# Patient Record
Sex: Male | Born: 1945 | Race: White | Hispanic: No | Marital: Married | Smoking: Former smoker
Health system: Southern US, Community
[De-identification: ages and names within clinical notes are randomized; demographics above are authoritative.]

## PROBLEM LIST (undated history)

## (undated) DIAGNOSIS — Z9289 Personal history of other medical treatment: Secondary | ICD-10-CM

## (undated) DIAGNOSIS — I447 Left bundle-branch block, unspecified: Secondary | ICD-10-CM

## (undated) DIAGNOSIS — Z8249 Family history of ischemic heart disease and other diseases of the circulatory system: Secondary | ICD-10-CM

## (undated) DIAGNOSIS — E785 Hyperlipidemia, unspecified: Secondary | ICD-10-CM

## (undated) HISTORY — DX: Left bundle-branch block, unspecified: I44.7

## (undated) HISTORY — DX: Hyperlipidemia, unspecified: E78.5

## (undated) HISTORY — PX: OTHER SURGICAL HISTORY: SHX169

## (undated) HISTORY — DX: Personal history of other medical treatment: Z92.89

## (undated) HISTORY — DX: Family history of ischemic heart disease and other diseases of the circulatory system: Z82.49

---

## 1994-10-05 HISTORY — PX: CARDIAC CATHETERIZATION: SHX172

## 2009-02-17 DIAGNOSIS — Z9289 Personal history of other medical treatment: Secondary | ICD-10-CM

## 2009-02-17 HISTORY — DX: Personal history of other medical treatment: Z92.89

## 2012-01-14 ENCOUNTER — Telehealth: Payer: Self-pay

## 2012-01-14 NOTE — Telephone Encounter (Signed)
Chart in Dr. Ellis Parents box

## 2012-01-14 NOTE — Telephone Encounter (Signed)
.  [facility]       Dr  Cleta Alberts REQUEST PTS  CHART BE PUT IN HIS BOX FOR REVIEW   BEST PHONE  FOR PT. [phone]

## 2012-01-15 NOTE — Telephone Encounter (Signed)
I have contacted the patient. He is scheduled to make an appointment to see me for followup CBC as well as iron studies and stool for occult blood.

## 2013-05-20 ENCOUNTER — Other Ambulatory Visit: Payer: Self-pay | Admitting: Internal Medicine

## 2013-05-20 ENCOUNTER — Ambulatory Visit: Payer: Self-pay | Admitting: Internal Medicine

## 2013-05-21 LAB — NMR LIPOPROFILE WITH LIPIDS
HDL Size: 8.2 nm — ABNORMAL LOW (ref >=9.2)
HDL-C: 38 mg/dL — ABNORMAL LOW (ref >=40)
LDL Particle Number: 2792 nmol/L — ABNORMAL HIGH (ref <1000)
LDL Size: 20.7 nm (ref >20.5)
Large HDL-P: 2.5 umol/L — ABNORMAL LOW (ref >=4.8)
Large VLDL-P: 1.4 nmol/L (ref <=2.7)
VLDL Size: 37.8 nm (ref <=46.6)

## 2013-05-26 ENCOUNTER — Ambulatory Visit (INDEPENDENT_AMBULATORY_CARE_PROVIDER_SITE_OTHER): Payer: Medicare PPO | Admitting: Internal Medicine

## 2013-05-26 ENCOUNTER — Encounter: Payer: Self-pay | Admitting: Internal Medicine

## 2013-05-26 VITALS — BP 134/80 | HR 71 | Ht 70.0 in | Wt 196.5 lb

## 2013-05-26 DIAGNOSIS — I447 Left bundle-branch block, unspecified: Secondary | ICD-10-CM | POA: Insufficient documentation

## 2013-05-26 DIAGNOSIS — Z79899 Other long term (current) drug therapy: Secondary | ICD-10-CM

## 2013-05-26 DIAGNOSIS — E785 Hyperlipidemia, unspecified: Secondary | ICD-10-CM

## 2013-05-26 DIAGNOSIS — Z8249 Family history of ischemic heart disease and other diseases of the circulatory system: Secondary | ICD-10-CM | POA: Insufficient documentation

## 2013-05-26 MED ORDER — EZETIMIBE 10 MG PO TABS: 10.0000 mg | ORAL_TABLET | Freq: Every day | ORAL | Status: DC

## 2013-05-26 MED ORDER — ATORVASTATIN CALCIUM 20 MG PO TABS: 20.0000 mg | ORAL_TABLET | Freq: Every day | ORAL | Status: DC

## 2013-05-26 NOTE — Progress Notes (Signed)
OFFICE NOTE  Chief Complaint:  Routine followup  Primary Care Physician: Lucilla Edin, MD  HPI:  Austin Moore is a pleasant 67 yo male with a strong family history of heart disease with his father having his 1st cardiac event at age 40. In addition to that, he has hyperlipidemia and has been intolerant to statins in the past because of elevated liver functions. He had been able to take Lipitor 10 mg tablets 3 times a week with no change in his liver functions and no myalgias. His blood pressure which is usually under good control away from this office still remains borderline high. He is completely asymptomatic. He has had no chest pain, no unusual shortness of breath, and no evidence of any arrhythmias. He developed a left bundle branch block in 2010 and as a result of that had a stress test performed in April 2010 that was negative for ischemia and showed a 68% ejection fraction. In 2011 the left bundle branch block had resolved and today he again has his left bundle branch block. It seemed to be intermittent but clearly does not appear to be ischemic mediated based on his stress test 2 years ago.   PMHx:  Past Medical History  Diagnosis Date  . Hyperlipidemia   . Family history of heart disease     Past Surgical History  Procedure Laterality Date  . Biospy  age 25    neck    FAMHx:  Family History  Problem Relation Age of Onset  . Cancer Mother   . Heart attack Father 44    SOCHx:   reports that he quit smoking about 40 years ago. His smoking use included Cigarettes. He smoked 0.00 packs per day. He has quit using smokeless tobacco. He reports that he drinks about 2.5 ounces of alcohol per week. He reports that he does not use illicit drugs.  ALLERGIES:  No Known Allergies  ROS: A comprehensive review of systems was negative except for: Constitutional: positive for fatigue  HOME MEDS: Current Outpatient Prescriptions  Medication Sig Dispense Refill  . aspirin 81  MG tablet Take 81 mg by mouth daily.      . fish oil-omega-3 fatty acids 1000 MG capsule Take by mouth daily.      Marland Kitchen MAGNESIUM PO Take 1 capsule by mouth daily.      Marland Kitchen atorvastatin (LIPITOR) 20 MG tablet Take 1 tablet (20 mg total) by mouth daily.  90 tablet  3  . ezetimibe (ZETIA) 10 MG tablet Take 1 tablet (10 mg total) by mouth daily.  21 tablet  0   No current facility-administered medications for this visit.    LABS/IMAGING: No results found for this or any previous visit (from the past 48 hour(s)). No results found.  VITALS: BP 134/80  Pulse 71  Ht 5\' 10"  (1.778 m)  Wt 196 lb 8 oz (89.132 kg)  BMI 28.19 kg/m2  EXAM: General appearance: alert and no distress Neck: no adenopathy, no carotid bruit, no JVD, supple, symmetrical, trachea midline and thyroid not enlarged, symmetric, no tenderness/mass/nodules Lungs: clear to auscultation bilaterally Heart: regular rate and rhythm, S1, S2 normal, no murmur, click, rub or gallop Abdomen: soft, non-tender; bowel sounds normal; no masses,  no organomegaly Extremities: extremities normal, atraumatic, no cyanosis or edema Pulses: 2+ and symmetric Skin: Skin color, texture, turgor normal. No rashes or lesions Neurologic: Grossly normal  EKG: Normal sinus rhythm at 71 with a left bundle branch block  ASSESSMENT: 1.  Stable left bundle branch block 2. Marked hyperlipidemia 3. Strong family history of premature coronary disease 4. Prior history of statin intolerance due to myalgias and elevated LFTs (Lipitor, Zocor, Vytorin, Crestor)  PLAN: 1.   Austin Moore continues to be asymptomatic and does exercise. He is stress test in 2010 did not show ischemia. He has a left bundle branch block which is chronic. Recently he underwent lipid profile testing which demonstrated a very high particle number of 2700, with elevated LDL cholesterol greater than 200. Based on this he is a very high risk of heart attack, and would benefit from marked statin  therapy. She is again willing to try statin medications but will need to follow his liver function CK is very closely. I recommend starting with Lipitor 20 mg daily and adding steady at 10 mg daily. 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 If he continues to have liver function abnormalities with Lipitor, however recommend trying Livalo, which is less heavily metabolized in the liver. Will plan a repeat cholesterol profile in 3 months and see him back at that time.  Chrystie Nose, MD, [facility] Attending Cardiologist [facility]  Austin Moore 05/26/2013, 12:52 PM

## 2013-05-26 NOTE — Patient Instructions (Addendum)
Your physician recommends that you return for lab work in: 1 week.  CMP, CK  Please have labwork done to check your cholesterol in 3 months. NMR with lipid  Your physician has recommended you make the following change in your medication: Start taking Lipitor 20mg  daily and Zetia 10mg  daily.   Dr. Rennis Golden would like you to follow up in 3-4 months, after your cholesterol blood work.

## 2013-08-11 ENCOUNTER — Encounter: Payer: Self-pay | Admitting: Emergency Medicine

## 2013-08-11 ENCOUNTER — Ambulatory Visit (INDEPENDENT_AMBULATORY_CARE_PROVIDER_SITE_OTHER): Payer: Medicare PPO | Admitting: Emergency Medicine

## 2013-08-11 ENCOUNTER — Other Ambulatory Visit: Payer: Self-pay | Admitting: Emergency Medicine

## 2013-08-11 VITALS — BP 144/77 | HR 68 | Temp 98.0°F | Resp 16 | Ht 71.0 in | Wt 191.0 lb

## 2013-08-11 DIAGNOSIS — N400 Enlarged prostate without lower urinary tract symptoms: Secondary | ICD-10-CM

## 2013-08-11 DIAGNOSIS — M25562 Pain in left knee: Secondary | ICD-10-CM

## 2013-08-11 DIAGNOSIS — R5381 Other malaise: Secondary | ICD-10-CM

## 2013-08-11 DIAGNOSIS — M25569 Pain in unspecified knee: Secondary | ICD-10-CM

## 2013-08-11 DIAGNOSIS — R5383 Other fatigue: Secondary | ICD-10-CM

## 2013-08-11 DIAGNOSIS — N529 Male erectile dysfunction, unspecified: Secondary | ICD-10-CM

## 2013-08-11 DIAGNOSIS — I2510 Atherosclerotic heart disease of native coronary artery without angina pectoris: Secondary | ICD-10-CM

## 2013-08-11 LAB — POCT URINALYSIS DIPSTICK
Bilirubin, UA: NEGATIVE
Blood, UA: NEGATIVE
Ketones, UA: NEGATIVE
Protein, UA: NEGATIVE
Spec Grav, UA: 1.015
pH, UA: 7

## 2013-08-11 LAB — CBC
HCT: 45.3 % (ref 39.0–52.0)
Hemoglobin: 15.5 g/dL (ref 13.0–17.0)
MCH: 28.2 pg (ref 26.0–34.0)
MCHC: 34.2 g/dL (ref 30.0–36.0)
MCV: 82.4 fL (ref 78.0–100.0)
RDW: 15.2 % (ref 11.5–15.5)

## 2013-08-11 MED ORDER — SILDENAFIL CITRATE 100 MG PO TABS: 50.0000 mg | ORAL_TABLET | Freq: Every day | ORAL | Status: DC | PRN

## 2013-08-11 NOTE — Progress Notes (Signed)
@[email or web address] @  Patient ID: Austin Moore MRN: [id number], DOB: 1946/02/25 67 y.o. Date of Encounter: 08/11/2013, 5:39 PM  Primary Physician: Lucilla Edin, MD  Chief Complaint: Physical (CPE)  HPI: 67 y.o. y/o male with history noted below here for CPE.  Doing well. No issues/complaints.  Review of Systems: Consitutional: No fever, chills, fatigue, night sweats, lymphadenopathy, or weight changes. Eyes: No visual changes, eye redness, or discharge. ENT/Mouth: Ears: No otalgia, tinnitus, hearing loss, discharge. Nose: No congestion, rhinorrhea, sinus pain, or epistaxis. Throat: No sore throat, post nasal drip, or teeth pain. He has noticed diminished hearing recently Cardiovascular: No CP, palpitations, diaphoresis, DOE, edema, orthopnea, PND. Patient has been to his cardiologist and had a recent good clearance from the cardiologist. He has been able to tolerate his recent statin dosages. He has not been having a severe muscle aches he had previously been Respiratory: No cough, hemoptysis, SOB, or wheezing. Gastrointestinal: No anorexia, dysphagia, reflux, pain, nausea, vomiting, hematemesis, diarrhea, constipation, BRBPR, or melena he is up-to-date on his colonoscopy is due for a repeat in about 2 years. Genitourinary: No dysuria, frequency, urgency, hematuria, incontinence, nocturia, decreased urinary stream, discharge, impotence, or testicular pain/masses. He is known to have an enlarged prostate he is gone through biopsies twice in the past with no findings of cancer Musculoskeletal: No decreased ROM, myalgias, stiffness, joint swelling, or weakness. Skin: No rash, erythema, lesion changes, pain, warmth, jaundice, or pruritis. Neurological: No headache, dizziness, syncope, seizures, tremors, memory loss, coordination problems, or paresthesias. Psychological: No anxiety, depression, hallucinations, SI/HI. Endocrine: No fatigue, polydipsia, polyphagia, polyuria, or known diabetes. All other  systems were reviewed and are otherwise negative.  Past Medical History  Diagnosis Date  . Hyperlipidemia   . Family history of heart disease      Past Surgical History  Procedure Laterality Date  . Biospy  age 68    neck    Home Meds:  Prior to Admission medications   Medication Sig Start Date End Date Taking? Authorizing Provider  aspirin 81 MG tablet Take 81 mg by mouth daily.   Yes Historical Provider, MD  atorvastatin (LIPITOR) 20 MG tablet Take 1 tablet (20 mg total) by mouth daily. 05/26/13  Yes Chrystie Nose, MD  ezetimibe (ZETIA) 10 MG tablet Take 1 tablet (10 mg total) by mouth daily. 05/26/13  Yes Chrystie Nose, MD  fish oil-omega-3 fatty acids 1000 MG capsule Take by mouth daily.   Yes Historical Provider, MD  MAGNESIUM PO Take 1 capsule by mouth daily.   Yes Historical Provider, MD  sildenafil (VIAGRA) 100 MG tablet Take 0.5-1 tablets (50-100 mg total) by mouth daily as needed for erectile dysfunction. 08/11/13   [name], MD    Allergies: No Known Allergies  History   Social History  . Marital Status: Married    Spouse Name: N/A    Number of Children: 1  . Years of Education: N/A   Occupational History  .  [employer]   Social History Main Topics  . Smoking status: Former Smoker    Types: Cigarettes    Quit date: 05/26/1973  . Smokeless tobacco: Former Neurosurgeon  . Alcohol Use: 2.5 oz/week    5 drink(s) per week  . Drug Use: No  . Sexual Activity: Not on file   Other Topics Concern  . Not on file   Social History Narrative  . No narrative on file    Family History  Problem Relation Age of Onset  .  Cancer Mother   . Heart disease Mother   . Heart attack Father 32  . Heart disease Father   . Cancer Sister     Physical Exam: Blood pressure 144/77, pulse 68, temperature 98 F (36.7 C), resp. rate 16, height 5\' 11"  (1.803 m), weight 191 lb (86.637 kg).  General: Well developed, well nourished, in no acute distress. HEENT: Normocephalic,  atraumatic. Conjunctiva pink, sclera non-icteric. Pupils 2 mm constricting to 1 mm, round, regular, and equally reactive to light and accomodation. EOMI. Internal auditory canal clear. TMs with good cone of light and without pathology. Nasal mucosa pink. Nares are without discharge. No sinus tenderness. Oral mucosa pink. Dentition . Pharynx without exudate.   Neck: Supple. Trachea midline. No thyromegaly. Full ROM. No lymphadenopathy. Lungs: Clear to auscultation bilaterally without wheezes, rales, or rhonchi. Breathing is of normal effort and unlabored. Cardiovascular: RRR with S1 S2. No murmurs, rubs, or gallops appreciated. Distal pulses 2+ symmetrically. No carotid or abdominal bruits.  Abdomen: Soft, non-tender, non-distended with normoactive bowel sounds. No hepatosplenomegaly or masses. No rebound/guarding. No CVA tenderness. Without hernias.  Rectal: No external hemorrhoids or fissures. Rectal vault without masses.   Genitourinary:   circumcised male. No penile lesions. Testes descended bilaterally, and smooth without tenderness or masses.  Musculoskeletal: Full range of motion and 5/5 strength throughout. Without swelling, atrophy, tenderness, crepitus, or warmth. Extremities without clubbing, cyanosis, or edema. Calves supple. Skin: Warm and moist without erythema, ecchymosis, wounds, or rash. Neuro: A+Ox3. CN II-XII grossly intact. Moves all extremities spontaneously. Full sensation throughout. Normal gait. DTR 2+ throughout upper and lower extremities. Finger to nose intact. Psych:  Responds to questions appropriately with a normal affect.   Studies: CBC, CMET, Lipid, PSA, TSH, Vitamin D, magnesium were all drawn.      Assessment/Plan:  67 y.o. y/o in for a physical. He would like to try Viagra and has had a recent cardiac evaluation. He is tolerating his statin as well the present time. He does have some problems with fatigue but not out of range for being 67. He is exercising on  a regular basis keeping his weight down and watching his diet and not smoking  -  Signed, Earl Lites, MD 08/11/2013 5:39 PM

## 2013-08-11 NOTE — Progress Notes (Deleted)
  Subjective:    Patient ID: Austin Moore, male    DOB: 04-Jan-1946, 67 y.o.   MRN: [id number]  HPI    Review of Systems  Constitutional: Positive for fatigue.  HENT: Positive for hearing loss, neck stiffness and tinnitus.        Objective:   Physical Exam        Assessment & Plan:

## 2013-08-12 LAB — LIPID PANEL
HDL: 39 mg/dL — ABNORMAL LOW (ref >39)
LDL Cholesterol: 81 mg/dL (ref 0–99)
Total CHOL/HDL Ratio: 3.5 Ratio
Triglycerides: 77 mg/dL (ref <150)

## 2013-08-12 LAB — COMPREHENSIVE METABOLIC PANEL
AST: 31 U/L (ref 0–37)
Albumin: 4.2 g/dL (ref 3.5–5.2)
Alkaline Phosphatase: 72 U/L (ref 39–117)
BUN: 13 mg/dL (ref 6–23)
Creat: 1.11 mg/dL (ref 0.50–1.35)
Glucose, Bld: 90 mg/dL (ref 70–99)
Potassium: 4.4 mEq/L (ref 3.5–5.3)
Total Bilirubin: 0.6 mg/dL (ref 0.3–1.2)

## 2013-08-15 ENCOUNTER — Encounter: Payer: Self-pay | Admitting: Family Medicine

## 2013-08-28 ENCOUNTER — Encounter: Payer: Self-pay | Admitting: *Deleted

## 2013-09-01 ENCOUNTER — Encounter: Payer: Self-pay | Admitting: Internal Medicine

## 2013-09-02 ENCOUNTER — Ambulatory Visit (INDEPENDENT_AMBULATORY_CARE_PROVIDER_SITE_OTHER): Payer: Medicare PPO | Admitting: Internal Medicine

## 2013-09-02 ENCOUNTER — Encounter: Payer: Self-pay | Admitting: Internal Medicine

## 2013-09-02 VITALS — BP 160/90 | HR 60 | Ht 70.0 in | Wt 193.4 lb

## 2013-09-02 DIAGNOSIS — E785 Hyperlipidemia, unspecified: Secondary | ICD-10-CM

## 2013-09-02 DIAGNOSIS — I447 Left bundle-branch block, unspecified: Secondary | ICD-10-CM

## 2013-09-02 DIAGNOSIS — Z79899 Other long term (current) drug therapy: Secondary | ICD-10-CM

## 2013-09-02 MED ORDER — EZETIMIBE 10 MG PO TABS: 10.0000 mg | ORAL_TABLET | Freq: Every day | ORAL | Status: DC

## 2013-09-02 NOTE — Patient Instructions (Signed)
Your physician wants you to follow-up in: 6 months. You will receive a reminder letter in the mail two months in advance. If you don't receive a letter, please call our office to schedule the follow-up appointment.  Please have lab work done prior to next office visit - you will need to be fasting.

## 2013-09-02 NOTE — Progress Notes (Signed)
OFFICE NOTE  Chief Complaint:  Routine followup  Primary Care Physician: Lucilla Edin, MD  HPI:  Austin Moore is a pleasant 67 yo male with a strong family history of heart disease with his father having his 1st cardiac event at age 88. In addition to that, he has hyperlipidemia and has been intolerant to statins in the past because of elevated liver functions. He had been able to take Lipitor 10 mg tablets 3 times a week with no change in his liver functions and no myalgias. His blood pressure which is usually under good control away from this office still remains borderline high. He is completely asymptomatic. He has had no chest pain, no unusual shortness of breath, and no evidence of any arrhythmias. He developed a left bundle branch block in 2010 and as a result of that had a stress test performed in April 2010 that was negative for ischemia and showed a 68% ejection fraction. In 2011 the left bundle branch block had resolved and today he again has his left bundle branch block. It seemed to be intermittent but clearly does not appear to be ischemic mediated based on his stress test 2 years ago.   At his last visit, I recommended adding low-dose Lipitor and Zetia back to his regimen. This is do to an elevated lipid profile with him an MR that demonstrated an LDL particle number of 2700. His LDL C. at that time was 213. Over the past 3 months he's increased his exercise as noted a marked improvement in his energy level. He says he's been tolerating his medications and has had no problems with muscle pain or soreness. His recent lipid profile demonstrated total cholesterol 135, triglycerides 77, HDL 39 and LDL 81, a marked improvement in his lipid profile. The remainder of his labs including a CK was normal at 95 an AST and ALT were both within normal limits. Does not appear that he is having the liver enzyme abnormalities she had in the past. He is very pleased with his current medicine  regimen.  PMHx:  Past Medical History  Diagnosis Date  . Hyperlipidemia     intolerant to statins - elevated LFTs  . Family history of heart disease   . LBBB (left bundle branch block)   . History of nuclear stress test 02/17/2009    dipyridamole; negative for ischemia, EF 68% - r/t LBBB    Past Surgical History  Procedure Laterality Date  . Biospy  age 78 - 1968    neck  . Cardiac catheterization  10/1994    after false positive Thallium test - normal L main/LAD; L Cfx is large dominant vessel, RCA non-dominant (Dr. Langston Reusing)    FAMHx:  Family History  Problem Relation Age of Onset  . Cancer Mother   . Heart disease Mother   . Heart attack Father 13  . Heart disease Father   . Cancer Sister     SOCHx:   reports that he quit smoking about 43 years ago. His smoking use included Cigarettes. He smoked 0.00 packs per day for 6 years. He has quit using smokeless tobacco. He reports that he drinks about 2.5 ounces of alcohol per week. He reports that he does not use illicit drugs.  ALLERGIES:  Allergies  Allergen Reactions  . Lipitor [Atorvastatin]     Elevated liver enzymes  . Vytorin [Ezetimibe-Simvastatin]     Elevated liver enzymes  . Zocor [Simvastatin]     Elevated  liver enzymes    ROS: A comprehensive review of systems was negative.  HOME MEDS: Current Outpatient Prescriptions  Medication Sig Dispense Refill  . aspirin 81 MG tablet Take 81 mg by mouth daily.      Marland Kitchen atorvastatin (LIPITOR) 20 MG tablet Take 1 tablet (20 mg total) by mouth daily.  90 tablet  3  . Cholecalciferol (VITAMIN D-3 PO) Take by mouth daily.      Marland Kitchen ezetimibe (ZETIA) 10 MG tablet Take 1 tablet (10 mg total) by mouth daily.  38 tablet  0  . fish oil-omega-3 fatty acids 1000 MG capsule Take by mouth every other day.       Marland Kitchen MAGNESIUM PO Take 1 capsule by mouth daily.      . sildenafil (VIAGRA) 100 MG tablet Take 0.5-1 tablets (50-100 mg total) by mouth daily as needed for erectile  dysfunction.  5 tablet  11   No current facility-administered medications for this visit.    LABS/IMAGING: No results found for this or any previous visit (from the past 48 hour(s)). No results found.  VITALS: BP 160/90  Pulse 60  Ht 5\' 10"  (1.778 m)  Wt 193 lb 6.4 oz (87.726 kg)  BMI 27.75 kg/m2  EXAM: General appearance: alert and no distress Lungs: clear to auscultation bilaterally Heart: regular rate and rhythm, S1, S2 normal, no murmur, click, rub or gallop Extremities: extremities normal, atraumatic, no cyanosis or edema  EKG: Normal sinus rhythm at 60 with a left bundle branch block  ASSESSMENT: 1. Stable left bundle branch block 2. Hyperlipidemia - significantly improved 3. Strong family history of premature coronary disease 4. Tolerating Lipitor, despite a prior history of statin intolerance due to myalgias and elevated LFTs (Lipitor, Zocor, Vytorin, Crestor)  PLAN: 1.   Mr. Nickolson has had a marked improvement in his lipid profile on his current regimen. There is no evidence of abnormalities in his LFTs or CK. He is tolerating his current medication regimen and will go ahead and give him more samples of Zetia today.  Have encouraged him to continue to work on exercise and diet modification, but feel that he is now a aggressively managing his risk factors.  Plan to see him back in 6 months with another lipid profile with NMR at that time.  Chrystie Nose, MD, [facility] Attending Cardiologist [facility]  HILTY,Kenneth C 09/02/2013, 8:59 AM

## 2014-01-08 ENCOUNTER — Telehealth: Payer: Self-pay | Admitting: *Deleted

## 2014-01-08 NOTE — Telephone Encounter (Signed)
Pt called stated that he needs to have a lab slip mailed to him so that he can have his blood work done.  CH

## 2014-01-08 NOTE — Telephone Encounter (Signed)
Returned call and pt verified x 2.  Pt informed message received and lab order not needed.  Informed order in computer system w/ [facility] and advised he arrive fasting to have labs drawn.  Pt verbalized understanding and agreed w/ plan.

## 2014-01-27 ENCOUNTER — Telehealth: Payer: Self-pay | Admitting: Internal Medicine

## 2014-01-27 DIAGNOSIS — Z79899 Other long term (current) drug therapy: Secondary | ICD-10-CM

## 2014-01-27 DIAGNOSIS — E782 Mixed hyperlipidemia: Secondary | ICD-10-CM

## 2014-01-27 LAB — COMPREHENSIVE METABOLIC PANEL
ALT: 34 U/L (ref 0–53)
AST: 32 U/L (ref 0–37)
Albumin: 4.4 g/dL (ref 3.5–5.2)
Alkaline Phosphatase: 75 U/L (ref 39–117)
BUN: 12 mg/dL (ref 6–23)
CO2: 27 mEq/L (ref 19–32)
Calcium: 9.2 mg/dL (ref 8.4–10.5)
Chloride: 106 mEq/L (ref 96–112)
Creat: 1.05 mg/dL (ref 0.50–1.35)
Glucose, Bld: 94 mg/dL (ref 70–99)
Potassium: 4.1 mEq/L (ref 3.5–5.3)
Sodium: 142 mEq/L (ref 135–145)
Total Bilirubin: 0.6 mg/dL (ref 0.2–1.2)
Total Protein: 6.7 g/dL (ref 6.0–8.3)

## 2014-01-27 NOTE — Telephone Encounter (Signed)
[facility] lab called stated Mr Austin Moore is also requesting a PSA be drawn with the lab that you have already ordered for him

## 2014-01-27 NOTE — Telephone Encounter (Signed)
Lab orders expired.  Reordered labs.  Unable to reach anyone in the lab.

## 2014-01-28 LAB — NMR LIPOPROFILE WITH LIPIDS
Cholesterol, Total: 148 mg/dL (ref <200)
HDL Particle Number: 29.1 umol/L — ABNORMAL LOW (ref >=30.5)
HDL Size: 8.5 nm — ABNORMAL LOW (ref >=9.2)
HDL-C: 36 mg/dL — ABNORMAL LOW (ref >=40)
LDL (calc): 99 mg/dL (ref <100)
LDL Particle Number: 1483 nmol/L — ABNORMAL HIGH (ref <1000)
LDL Size: 20.1 nm — ABNORMAL LOW (ref >20.5)
LP-IR Score: 55 — ABNORMAL HIGH (ref <=45)
Large HDL-P: 1.4 umol/L — ABNORMAL LOW (ref >=4.8)
Large VLDL-P: 0.9 nmol/L (ref <=2.7)
Small LDL Particle Number: 1026 nmol/L — ABNORMAL HIGH (ref <=527)
Triglycerides: 67 mg/dL (ref <150)
VLDL Size: 44.4 nm (ref <=46.6)

## 2014-01-29 ENCOUNTER — Encounter: Payer: Self-pay | Admitting: Internal Medicine

## 2014-01-29 ENCOUNTER — Ambulatory Visit (INDEPENDENT_AMBULATORY_CARE_PROVIDER_SITE_OTHER): Payer: Medicare PPO | Admitting: Internal Medicine

## 2014-01-29 VITALS — BP 134/80 | HR 67 | Ht 70.0 in | Wt 191.1 lb

## 2014-01-29 DIAGNOSIS — Z8249 Family history of ischemic heart disease and other diseases of the circulatory system: Secondary | ICD-10-CM

## 2014-01-29 DIAGNOSIS — I447 Left bundle-branch block, unspecified: Secondary | ICD-10-CM

## 2014-01-29 DIAGNOSIS — E785 Hyperlipidemia, unspecified: Secondary | ICD-10-CM

## 2014-01-29 NOTE — Patient Instructions (Signed)
Your physician wants you to follow-up in: 1 year. You will receive a reminder letter in the mail two months in advance. If you don't receive a letter, please call our office to schedule the follow-up appointment.  

## 2014-01-29 NOTE — Progress Notes (Signed)
OFFICE NOTE  Chief Complaint:  Routine followup  Primary Care Physician: Lucilla Edin, MD  HPI:  Austin Moore is a pleasant 68 yo male with a strong family history of heart disease with his father having his 1st cardiac event at age 29. In addition to that, he has hyperlipidemia and has been intolerant to statins in the past because of elevated liver functions. He had been able to take Lipitor 10 mg tablets 3 times a week with no change in his liver functions and no myalgias. His blood pressure which is usually under good control away from this office still remains borderline high. He is completely asymptomatic. He has had no chest pain, no unusual shortness of breath, and no evidence of any arrhythmias. He developed a left bundle branch block in 2010 and as a result of that had a stress test performed in April 2010 that was negative for ischemia and showed a 68% ejection fraction. In 2011 the left bundle branch block had resolved and today he again has his left bundle branch block. It seemed to be intermittent but clearly does not appear to be ischemic mediated based on his stress test 2 years ago.   At his last visit, I recommended adding low-dose Lipitor and Zetia back to his regimen. This is do to an elevated lipid profile with him an MR that demonstrated an LDL particle number of 2700. His LDL C. at that time was 213. Over the past 3 months he's increased his exercise as noted a marked improvement in his energy level. He says he's been tolerating his medications and has had no problems with muscle pain or soreness. His recent lipid profile demonstrated total cholesterol 135, triglycerides 77, HDL 39 and LDL 81, a marked improvement in his lipid profile. The remainder of his labs including a CK was normal at 95 an AST and ALT were both within normal limits. Does not appear that he is having the liver enzyme abnormalities she had in the past. He is very pleased with his current medicine  regimen.  Austin Moore returns today for followup of his lipid profile.  There's been a slight increase in cholesterol since his last cholesterol test however LDL particle number is improved. Currently his LDL-P is 1483, down from 2792.  LDL C. is 99, down from 213. Total cholesterol is 148. This demonstrated marked improvement over his previous blood work which is in part due to his exercise, diet and the addition of Zetia. I've been hesitant to increase the dose of Lipitor due to his abnormal liver function and problems with statins in the past. He seems to be tolerating the medication combination currently without side effects. Comprehensive metabolic profile shows normal AST and ALT as well as renal function.  PMHx:  Past Medical History  Diagnosis Date  . Hyperlipidemia     intolerant to statins - elevated LFTs  . Family history of heart disease   . LBBB (left bundle branch block)   . History of nuclear stress test 02/17/2009    dipyridamole; negative for ischemia, EF 68% - r/t LBBB    Past Surgical History  Procedure Laterality Date  . Biospy  age 65 - 1968    neck  . Cardiac catheterization  10/1994    after false positive Thallium test - normal L main/LAD; L Cfx is large dominant vessel, RCA non-dominant (Dr. Langston Reusing)    FAMHx:  Family History  Problem Relation Age of Onset  .  Cancer Mother   . Heart disease Mother   . Heart attack Father [id number]  . Heart disease Father   . Cancer Sister     SOCHx:   reports that he quit smoking about 43 years ago. His smoking use included Cigarettes. He smoked 0.00 packs per day for 6 years. He has quit using smokeless tobacco. He reports that he drinks about 2.5 ounces of alcohol per week. He reports that he does not use illicit drugs.  ALLERGIES:  Allergies  Allergen Reactions  . Lipitor [Atorvastatin]     Elevated liver enzymes  . Vytorin [Ezetimibe-Simvastatin]     Elevated liver enzymes  . Zocor [Simvastatin]     Elevated liver  enzymes    ROS: A comprehensive review of systems was negative.  HOME MEDS: Current Outpatient Prescriptions  Medication Sig Dispense Refill  . aspirin 81 MG tablet Take 81 mg by mouth daily.      Marland Kitchen. atorvastatin (LIPITOR) 20 MG tablet Take 1 tablet (20 mg total) by mouth daily.  90 tablet  3  . Cholecalciferol (VITAMIN D-3 PO) Take by mouth daily.      Marland Kitchen. ezetimibe (ZETIA) 10 MG tablet Take 1 tablet (10 mg total) by mouth daily.  38 tablet  0  . fish oil-omega-3 fatty acids 1000 MG capsule Take by mouth every other day.       Marland Kitchen. MAGNESIUM PO Take 1 capsule by mouth daily.      . sildenafil (VIAGRA) 100 MG tablet Take 0.5-1 tablets (50-100 mg total) by mouth daily as needed for erectile dysfunction.  5 tablet  11   No current facility-administered medications for this visit.    LABS/IMAGING: No results found for this or any previous visit (from the past 48 hour(s)). No results found.  VITALS: BP 134/80  Pulse 67  Ht 5\' 10"  (1.778 m)  Wt 191 lb 1.6 oz (86.682 kg)  BMI 27.42 kg/m2  EXAM: deferred  EKG: Normal sinus rhythm at 67 with a left bundle branch block  ASSESSMENT: 1. Stable left bundle branch block 2. Hyperlipidemia - significantly improved 3. Strong family history of premature coronary disease 4. Tolerating Lipitor and Zetia, despite a prior history of statin intolerance due to myalgias and elevated LFTs (Lipitor, Zocor, Vytorin, Crestor)  PLAN: 1.   Austin Moore has had a marked improvement in his lipid profile on his current regimen. There is no evidence of abnormalities in his LFTs or CK. He is tolerating his current medication regimen.  He continues to exercise and I again encouraged him to continue to work on exercise and diet modification.  Plan to see him back in 1 year or sooner as necessary.  Chrystie NoseKenneth C. Hilty, MD, [facility] Attending Cardiologist [facility]  HILTY,Kenneth C 01/29/2014, 1:07 PM

## 2014-10-16 ENCOUNTER — Other Ambulatory Visit: Payer: Self-pay | Admitting: Internal Medicine

## 2014-10-18 NOTE — Telephone Encounter (Signed)
Rx refill sent to patient pharmacy   

## 2014-11-16 ENCOUNTER — Encounter: Payer: Self-pay | Admitting: Emergency Medicine

## 2014-11-16 ENCOUNTER — Ambulatory Visit (INDEPENDENT_AMBULATORY_CARE_PROVIDER_SITE_OTHER): Payer: Medicare PPO | Admitting: Emergency Medicine

## 2014-11-16 VITALS — BP 130/92 | HR 72 | Temp 98.3°F | Resp 16 | Ht 70.25 in | Wt 189.8 lb

## 2014-11-16 DIAGNOSIS — N400 Enlarged prostate without lower urinary tract symptoms: Secondary | ICD-10-CM

## 2014-11-16 DIAGNOSIS — H539 Unspecified visual disturbance: Secondary | ICD-10-CM

## 2014-11-16 DIAGNOSIS — Z1211 Encounter for screening for malignant neoplasm of colon: Secondary | ICD-10-CM

## 2014-11-16 DIAGNOSIS — Z0000 Encounter for general adult medical examination without abnormal findings: Secondary | ICD-10-CM

## 2014-11-16 DIAGNOSIS — Z23 Encounter for immunization: Secondary | ICD-10-CM

## 2014-11-16 DIAGNOSIS — E785 Hyperlipidemia, unspecified: Secondary | ICD-10-CM

## 2014-11-16 LAB — CBC WITH DIFFERENTIAL/PLATELET
BASOS ABS: 0.1 10*3/uL (ref 0.0–0.1)
Basophils Relative: 1 % (ref 0–1)
Eosinophils Absolute: 0.2 10*3/uL (ref 0.0–0.7)
Eosinophils Relative: 4 % (ref 0–5)
HEMATOCRIT: 49.8 % (ref 39.0–52.0)
Hemoglobin: 16.4 g/dL (ref 13.0–17.0)
LYMPHS PCT: 25 % (ref 12–46)
Lymphs Abs: 1.6 10*3/uL (ref 0.7–4.0)
MCH: 27.9 pg (ref 26.0–34.0)
MCHC: 32.9 g/dL (ref 30.0–36.0)
MCV: 84.8 fL (ref 78.0–100.0)
MONOS PCT: 7 % (ref 3–12)
MPV: 9.3 fL (ref 8.6–12.4)
Monocytes Absolute: 0.4 10*3/uL (ref 0.1–1.0)
Neutro Abs: 3.9 10*3/uL (ref 1.7–7.7)
Neutrophils Relative %: 63 % (ref 43–77)
Platelets: 275 10*3/uL (ref 150–400)
RBC: 5.87 MIL/uL — AB (ref 4.22–5.81)
RDW: 13.6 % (ref 11.5–15.5)
WBC: 6.2 10*3/uL (ref 4.0–10.5)

## 2014-11-16 LAB — COMPLETE METABOLIC PANEL WITH GFR
ALBUMIN: 4.1 g/dL (ref 3.5–5.2)
ALT: 21 U/L (ref 0–53)
AST: 27 U/L (ref 0–37)
Alkaline Phosphatase: 70 U/L (ref 39–117)
BUN: 15 mg/dL (ref 6–23)
CALCIUM: 9.1 mg/dL (ref 8.4–10.5)
CO2: 26 mEq/L (ref 19–32)
Chloride: 105 mEq/L (ref 96–112)
Creat: 1.07 mg/dL (ref 0.50–1.35)
GFR, Est African American: 82 mL/min
GFR, Est Non African American: 71 mL/min
Glucose, Bld: 97 mg/dL (ref 70–99)
Potassium: 4.3 mEq/L (ref 3.5–5.3)
Sodium: 141 mEq/L (ref 135–145)
TOTAL PROTEIN: 6.7 g/dL (ref 6.0–8.3)
Total Bilirubin: 0.7 mg/dL (ref 0.2–1.2)

## 2014-11-16 LAB — TSH: TSH: 1.989 u[IU]/mL (ref 0.350–4.500)

## 2014-11-16 LAB — LIPID PANEL
CHOL/HDL RATIO: 4.5 ratio
CHOLESTEROL: 184 mg/dL (ref 0–200)
HDL: 41 mg/dL (ref >39)
LDL Cholesterol: 128 mg/dL — ABNORMAL HIGH (ref 0–99)
Triglycerides: 76 mg/dL (ref <150)
VLDL: 15 mg/dL (ref 0–40)

## 2014-11-16 LAB — POCT URINALYSIS DIPSTICK
BILIRUBIN UA: NEGATIVE
Blood, UA: NEGATIVE
Glucose, UA: NEGATIVE
Ketones, UA: NEGATIVE
LEUKOCYTES UA: NEGATIVE
Nitrite, UA: NEGATIVE
PH UA: 5.5
PROTEIN UA: NEGATIVE
Spec Grav, UA: 1.015
Urobilinogen, UA: 0.2

## 2014-11-16 LAB — IFOBT (OCCULT BLOOD): IFOBT: NEGATIVE

## 2014-11-16 NOTE — Progress Notes (Signed)
Subjective:  This chart was scribed for Austin Gobble, MD by Charline Bills, ED Scribe. The patient was seen in room 21. Patient's care was started at 2:03 PM.   Patient ID: Austin Moore, male    DOB: 1946-01-12, 69 y.o.   MRN: [id number]  Chief Complaint  Patient presents with  . Annual Exam    no medication refills   HPI HPI Comments: Austin Moore is a 69 y.o. male, with a h/o LBBB, hyperlipidemia, who presents to the [facility] for an annual exam.   Pt states that he has been going to the gym for approximately 2 years now, but recently stopped due to the holiday.   Abdominal Pain Pt reports constant RUQ pain that he describes as a pulling sensation present for the past few days. He suspects that he pulled a muscle while doing crunches.    Urology  Pt states that he had a flare up of similar prostate problems. Pt reports limited urine flow which has resolved. Pt states that symptoms are exacerbated with alcohol and coffee consumption; which he reports an increase of during the holidays.   Mouth Pt recently noticed a dark spot inside his L lower lip that he wishes to have evaluated. He denies pain.   Vision Pt has noticed irregularity and an "arc" in his vision over the past 3-4 months. Pt reports a few episodes that each last for a few minutes. He states that he was dehydrated 2 times when he has experienced this. Pt denies vision loss. He has never had carotid neck study done.   Preventative Maintenance  Last colonoscopy was done by Dr. Kinnie Scales approximately 10 years ago. Pt sees his eye doctor x1 every 2 years.  Cardiologist: Hilty  Urologist: Vernie Ammons; pt has been released  Pt recently had a new granddaughter 3 months ago; total of 3 grandchildren in  [place].   Past Medical History  Diagnosis Date  . Hyperlipidemia     intolerant to statins - elevated LFTs  . Family history of heart disease   . LBBB (left bundle branch block)   .  History of nuclear stress test 02/17/2009    dipyridamole; negative for ischemia, EF 68% - r/t LBBB   Current Outpatient Prescriptions on File Prior to Visit  Medication Sig Dispense Refill  . aspirin 81 MG tablet Take 81 mg by mouth daily.    Marland Kitchen atorvastatin (LIPITOR) 20 MG tablet TAKE 1 TABLET (20 MG TOTAL) BY MOUTH DAILY. 90 tablet 0  . Cholecalciferol (VITAMIN D-3 PO) Take by mouth daily.    Marland Kitchen ezetimibe (ZETIA) 10 MG tablet Take 1 tablet (10 mg total) by mouth daily. 38 tablet 0  . fish oil-omega-3 fatty acids 1000 MG capsule Take by mouth every other day.     Marland Kitchen MAGNESIUM PO Take 1 capsule by mouth daily.    . sildenafil (VIAGRA) 100 MG tablet Take 0.5-1 tablets (50-100 mg total) by mouth daily as needed for erectile dysfunction. (Patient not taking: Reported on 11/16/2014) 5 tablet 11   No current facility-administered medications on file prior to visit.   Allergies  Allergen Reactions  . Lipitor [Atorvastatin]     Elevated liver enzymes  . Vytorin [Ezetimibe-Simvastatin]     Elevated liver enzymes  . Zocor [Simvastatin]     Elevated liver enzymes   Review of Systems  Eyes: Positive for visual disturbance.      Objective:   Physical Exam CONSTITUTIONAL:  Well developed/well nourished HEAD: Normocephalic/atraumatic EYES: EOMI/PERRL ENMT: Mucous membranes moist, 3 mm venous lake to lower lip NECK: supple no meningeal signs SPINE/BACK:entire spine nontender CV: S1/S2 noted, no murmurs/rubs/gallops noted LUNGS: Lungs are clear to auscultation bilaterally, no apparent distress ABDOMEN: soft, nontender, no rebound or guarding, bowel sounds noted throughout abdomen GU:no cva tenderness, significantly enlarged, smooth, nontender  NEURO: Pt is awake/alert/appropriate, moves all extremitiesx4.  No facial droop.   EXTREMITIES: pulses normal/equal, full ROM SKIN: warm, color normal PSYCH: no abnormalities of mood noted, alert and oriented to situation    Assessment & Plan:  He has  had his shingles vaccine. He is up-to-date on tetanus but has not had a T gap and has 3 grand  kids. He will receive Prevnar today. He will return to clinic in 4-6 weeks to get a T dap. Referral made to GI for colonoscopy. Referral made for carotid Dopplers for his 2 episodes of vision disturbance. His blood pressure is elevated today.I personally performed the services described in this documentation, which was scribed in my presence. The recorded information has been reviewed and is accurate.

## 2014-11-16 NOTE — Addendum Note (Signed)
Addended by: Gerrianne ScalePAYNE, Jamylah Marinaccio L on: 11/16/2014 02:54 PM   Modules accepted: Orders

## 2014-11-17 LAB — PSA, MEDICARE: PSA: 5.74 ng/mL — ABNORMAL HIGH (ref <=4.00)

## 2015-01-24 ENCOUNTER — Telehealth: Payer: Self-pay | Admitting: Internal Medicine

## 2015-01-24 NOTE — Telephone Encounter (Signed)
I agree .Marland Kitchen. These tests are not necessary from a cardiology perspective. Would defer to PCP on ordering these when they feel they are appropriate. He used to see Dr. Ronal Fearttlein (urology), but is no longer followed.  Dr. HRexene Edison

## 2015-01-24 NOTE — Telephone Encounter (Signed)
Returned call to patient - he states he has been working on his diet and would like to have his PSA rechecked and a CRP done prior to OV in April.   Patient was informed that Dr. Cleta Albertsaub checked labs in Jan - including PSA  Patient informed that Dr. Blanchie DessertHilty's last OV note from 1 year ago did not indicate the need for these particular labs to be drawn.   Patient was adamant about having these done prior to OV.   Deferred to Dr. Rennis GoldenHilty to advise

## 2015-01-24 NOTE — Telephone Encounter (Signed)
Pt called and requested that some lab orders for PSA and C reactive protein test for when he comes in to see Dr. Rennis GoldenHilty. He would like for someone to call and confirm when those labs have been put in to the system. Please call  Thanks

## 2015-01-25 NOTE — Telephone Encounter (Signed)
Left message for patient that Dr. Rennis GoldenHilty is not going to order labs - best follow by PCP or previous urologist.

## 2015-01-26 ENCOUNTER — Telehealth: Payer: Self-pay

## 2015-01-26 NOTE — Telephone Encounter (Signed)
Patient will come to 10 4 in the morning for CRP, PSA, and lipid panel. He is been a patient of Dr. Billy Coastttelins and has had prostate biopsies on 2 separate occasions.

## 2015-01-26 NOTE — Telephone Encounter (Signed)
Left message for pt to call back. He had a PSa back in January, why is he wanting a CRP?

## 2015-01-26 NOTE — Telephone Encounter (Signed)
Spoke with pt, he is asking to get a CRP and another PSA because he has changed his diet. He has had no alcohol, no caffeine, and eating better excluding white carbs. Please advise. He feels this should help his lipids and test results.

## 2015-01-26 NOTE — Telephone Encounter (Signed)
Patient was seen back in January for a CPE with Dr. Cleta Albertsaub. He wants to know if we can put in orders for him to have a PSA and CRP test done. He says it can be done here or at a place like [facility]. Can we order these labs? Cb# [phone]. He is ok with paying for the tests.

## 2015-01-27 ENCOUNTER — Other Ambulatory Visit: Payer: Medicare PPO | Admitting: Family Medicine

## 2015-01-27 DIAGNOSIS — Z1211 Encounter for screening for malignant neoplasm of colon: Secondary | ICD-10-CM

## 2015-01-27 DIAGNOSIS — N400 Enlarged prostate without lower urinary tract symptoms: Secondary | ICD-10-CM

## 2015-01-27 DIAGNOSIS — E785 Hyperlipidemia, unspecified: Secondary | ICD-10-CM

## 2015-01-27 LAB — LIPID PANEL
Cholesterol: 136 mg/dL (ref 0–200)
HDL: 36 mg/dL — ABNORMAL LOW (ref >=40)
LDL CALC: 89 mg/dL (ref 0–99)
TRIGLYCERIDES: 56 mg/dL (ref <150)
Total CHOL/HDL Ratio: 3.8 Ratio
VLDL: 11 mg/dL (ref 0–40)

## 2015-01-27 LAB — C-REACTIVE PROTEIN: CRP: <0.5 mg/dL (ref <0.60)

## 2015-01-28 ENCOUNTER — Other Ambulatory Visit: Payer: Self-pay | Admitting: Emergency Medicine

## 2015-01-28 DIAGNOSIS — R972 Elevated prostate specific antigen [PSA]: Secondary | ICD-10-CM

## 2015-01-28 LAB — PSA: PSA: 6.65 ng/mL — ABNORMAL HIGH (ref <=4.00)

## 2015-02-28 ENCOUNTER — Telehealth: Payer: Self-pay | Admitting: Internal Medicine

## 2015-02-28 ENCOUNTER — Encounter: Payer: Self-pay | Admitting: Internal Medicine

## 2015-02-28 ENCOUNTER — Ambulatory Visit (INDEPENDENT_AMBULATORY_CARE_PROVIDER_SITE_OTHER): Payer: Medicare PPO | Admitting: Internal Medicine

## 2015-02-28 VITALS — BP 142/70 | HR 88 | Ht 70.0 in | Wt 192.4 lb

## 2015-02-28 DIAGNOSIS — I447 Left bundle-branch block, unspecified: Secondary | ICD-10-CM | POA: Diagnosis not present

## 2015-02-28 DIAGNOSIS — E785 Hyperlipidemia, unspecified: Secondary | ICD-10-CM

## 2015-02-28 DIAGNOSIS — Z79899 Other long term (current) drug therapy: Secondary | ICD-10-CM

## 2015-02-28 DIAGNOSIS — Z8249 Family history of ischemic heart disease and other diseases of the circulatory system: Secondary | ICD-10-CM | POA: Diagnosis not present

## 2015-02-28 LAB — COMPREHENSIVE METABOLIC PANEL
ALK PHOS: 63 U/L (ref 39–117)
ALT: 22 U/L (ref 0–53)
AST: 26 U/L (ref 0–37)
Albumin: 4 g/dL (ref 3.5–5.2)
BUN: 13 mg/dL (ref 6–23)
CO2: 26 mEq/L (ref 19–32)
Calcium: 9 mg/dL (ref 8.4–10.5)
Chloride: 105 mEq/L (ref 96–112)
Creat: 1.09 mg/dL (ref 0.50–1.35)
GLUCOSE: 87 mg/dL (ref 70–99)
Potassium: 4.4 mEq/L (ref 3.5–5.3)
SODIUM: 141 meq/L (ref 135–145)
Total Bilirubin: 0.5 mg/dL (ref 0.2–1.2)
Total Protein: 6.7 g/dL (ref 6.0–8.3)

## 2015-02-28 LAB — LIPID PANEL
CHOL/HDL RATIO: 4.6 ratio
Cholesterol: 193 mg/dL (ref 0–200)
HDL: 42 mg/dL (ref >=40)
LDL Cholesterol: 132 mg/dL — ABNORMAL HIGH (ref 0–99)
Triglycerides: 93 mg/dL (ref <150)
VLDL: 19 mg/dL (ref 0–40)

## 2015-02-28 MED ORDER — EZETIMIBE 10 MG PO TABS
10.0000 mg | ORAL_TABLET | Freq: Every day | ORAL | Status: DC
Start: 2015-02-28 — End: 2016-04-10

## 2015-02-28 NOTE — Telephone Encounter (Signed)
[facility] prior to appt. Fasting. Lab orders submitted.

## 2015-02-28 NOTE — Patient Instructions (Signed)
Your physician wants you to follow-up in: 1 year with Dr. Hilty. You will receive a reminder letter in the mail two months in advance. If you don't receive a letter, please call our office to schedule the follow-up appointment.  

## 2015-03-01 NOTE — Progress Notes (Signed)
OFFICE NOTE  Chief Complaint:  Routine followup  Primary Care Physician: Jenny Reichmann, MD  HPI:  Austin Moore is a pleasant 69 yo male with a strong family history of heart disease with his father having his 1st cardiac event at age 80. In addition to that, he has hyperlipidemia and has been intolerant to statins in the past because of elevated liver functions. He had been able to take Lipitor 10 mg tablets 3 times a week with no change in his liver functions and no myalgias. His blood pressure which is usually under good control away from this office still remains borderline high. He is completely asymptomatic. He has had no chest pain, no unusual shortness of breath, and no evidence of any arrhythmias. He developed a left bundle branch block in 2010 and as a result of that had a stress test performed in April 2010 that was negative for ischemia and showed a 68% ejection fraction. In 2011 the left bundle branch block had resolved and today he again has his left bundle branch block. It seemed to be intermittent but clearly does not appear to be ischemic mediated based on his stress test 2 years ago.   At his last visit, I recommended adding low-dose Lipitor and Zetia back to his regimen. This is do to an elevated lipid profile with him an MR that demonstrated an LDL particle number of 2700. His LDL C. at that time was 213. Over the past 3 months he's increased his exercise as noted a marked improvement in his energy level. He says he's been tolerating his medications and has had no problems with muscle pain or soreness. His recent lipid profile demonstrated total cholesterol 135, triglycerides 77, HDL 39 and LDL 81, a marked improvement in his lipid profile. The remainder of his labs including a CK was normal at 95 an AST and ALT were both within normal limits. Does not appear that he is having the liver enzyme abnormalities she had in the past. He is very pleased with his current medicine  regimen.  Austin Moore returns today for followup of his lipid profile.  There's been a slight increase in cholesterol since his last cholesterol test however LDL particle number is improved. Currently his LDL-P is 1483, down from 2792.  LDL C. is 99, down from 213. Total cholesterol is 148. This demonstrated marked improvement over his previous blood work which is in part due to his exercise, diet and the addition of Zetia. I've been hesitant to increase the dose of Lipitor due to his abnormal liver function and problems with statins in the past. He seems to be tolerating the medication combination currently without side effects. Comprehensive metabolic profile shows normal AST and ALT as well as renal function.  I saw Austin Moore acted in the office. Overall he is doing well denies any chest discomfort or worsening shortness of breath. He's had a pretty good control of his lipid profile but takes Lipitor only 3-4 times a week as well as Zetia. EKG shows a stable left bundle branch block.  PMHx:  Past Medical History  Diagnosis Date  . Hyperlipidemia     intolerant to statins - elevated LFTs  . Family history of heart disease   . LBBB (left bundle branch block)   . History of nuclear stress test 02/17/2009    dipyridamole; negative for ischemia, EF 68% - r/t LBBB    Past Surgical History  Procedure Laterality Date  .  Biospy  age 18 - 1968    neck  . Cardiac catheterization  10/1994    after false positive Thallium test - normal L main/LAD; L Cfx is large dominant vessel, RCA non-dominant (Dr. Rockne Menghini)    FAMHx:  Family History  Problem Relation Age of Onset  . Cancer Mother   . Heart disease Mother   . Heart attack Father 42  . Heart disease Father   . Cancer Sister     SOCHx:   reports that he quit smoking about 44 years ago. His smoking use included Cigarettes. He quit after 6 years of use. He has quit using smokeless tobacco. He reports that he drinks about 2.5 oz of alcohol  per week. He reports that he does not use illicit drugs.  ALLERGIES:  Allergies  Allergen Reactions  . Peanut-Containing Drug Products Shortness Of Breath    Feels mild shortness of breath  . Lipitor [Atorvastatin]     Elevated liver enzymes  . Vytorin [Ezetimibe-Simvastatin]     Elevated liver enzymes  . Zocor [Simvastatin]     Elevated liver enzymes    ROS: A comprehensive review of systems was negative.  HOME MEDS: Current Outpatient Prescriptions  Medication Sig Dispense Refill  . aspirin 81 MG tablet Take 81 mg by mouth daily.    Marland Kitchen atorvastatin (LIPITOR) 20 MG tablet TAKE 1 TABLET (20 MG TOTAL) BY MOUTH DAILY. 90 tablet 0  . Cholecalciferol (VITAMIN D-3 PO) Take by mouth daily.    Marland Kitchen ezetimibe (ZETIA) 10 MG tablet Take 1 tablet (10 mg total) by mouth daily. (Patient taking differently: Take 10 mg by mouth daily. Takes 3-4x/week) 30 tablet 6  . fish oil-omega-3 fatty acids 1000 MG capsule Take by mouth every other day.     Marland Kitchen MAGNESIUM PO Take 1 capsule by mouth daily.     No current facility-administered medications for this visit.    LABS/IMAGING: Results for orders placed or performed in visit on 02/28/15 (from the past 48 hour(s))  Comp Met (CMET)     Status: None   Collection Time: 02/28/15  9:22 AM  Result Value Ref Range   Sodium 141 135 - 145 mEq/L   Potassium 4.4 3.5 - 5.3 mEq/L   Chloride 105 96 - 112 mEq/L   CO2 26 19 - 32 mEq/L   Glucose, Bld 87 70 - 99 mg/dL   BUN 13 6 - 23 mg/dL   Creat 1.09 0.50 - 1.35 mg/dL   Total Bilirubin 0.5 0.2 - 1.2 mg/dL   Alkaline Phosphatase 63 39 - 117 U/L   AST 26 0 - 37 U/L   ALT 22 0 - 53 U/L   Total Protein 6.7 6.0 - 8.3 g/dL   Albumin 4.0 3.5 - 5.2 g/dL   Calcium 9.0 8.4 - 10.5 mg/dL  Lipid Profile     Status: Abnormal   Collection Time: 02/28/15  9:22 AM  Result Value Ref Range   Cholesterol 193 0 - 200 mg/dL    Comment: ATP III Classification:       < 200        mg/dL        Desirable      200 - 239     mg/dL         Borderline High      >= 240        mg/dL        High      Triglycerides 93 <150  mg/dL   HDL 42 >=40 mg/dL    Comment: ** Please note change in reference range(s). **   Total CHOL/HDL Ratio 4.6 Ratio   VLDL 19 0 - 40 mg/dL   LDL Cholesterol 132 (H) 0 - 99 mg/dL    Comment:   Total Cholesterol/HDL Ratio:CHD Risk                        Coronary Heart Disease Risk Table                                        Men       Women          1/2 Average Risk              3.4        3.3              Average Risk              5.0        4.4           2X Average Risk              9.6        7.1           3X Average Risk             23.4       11.0 Use the calculated Patient Ratio above and the CHD Risk table  to determine the patient's CHD Risk. ATP III Classification (LDL):       < 100        mg/dL         Optimal      100 - 129     mg/dL         Near or Above Optimal      130 - 159     mg/dL         Borderline High      160 - 189     mg/dL         High       > 190        mg/dL         Very High      No results found.  VITALS: BP 142/70 mmHg  Pulse 88  Ht _0  (1.778 m)  Wt 192 lb 6.4 oz (87.272 kg)  BMI 27.61 kg/m2  EXAM: General appearance: alert and no distress Neck: no carotid bruit and no JVD Lungs: clear to auscultation bilaterally Heart: regular rate and rhythm, S1, S2 normal, no murmur, click, rub or gallop Abdomen: soft, non-tender; bowel sounds normal; no masses,  no organomegaly Extremities: extremities normal, atraumatic, no cyanosis or edema Pulses: 2+ and symmetric Skin: Skin color, texture, turgor normal. No rashes or lesions Neurologic: Grossly normal Psych: Pleasant  EKG: Normal sinus rhythm at 88 with a left bundle branch block  ASSESSMENT: 1. Stable left bundle branch block 2. Hyperlipidemia - significantly improved 3. Strong family history of premature coronary disease 4. Tolerating Lipitor and Zetia, despite a prior history of statin intolerance  due to myalgias and elevated LFTs (Lipitor, Zocor, Vytorin, Crestor)  PLAN: 1.   Austin Moore has had a marked improvement in his lipid profile on his current regimen. This is despite only taking the  medication 3-4 times a week. I think at this point I would recommend he continue what he is doing currently. He denies any new cardiac symptoms. He's exercising regularly and is without complaints. Plan to see him back annually or sooner as necessary.  Pixie Casino, MD, [facility] Attending Cardiologist [facility] [place] [facility] 03/01/2015, 4:53 PM

## 2015-03-02 ENCOUNTER — Encounter: Payer: Self-pay | Admitting: *Deleted

## 2015-05-10 ENCOUNTER — Other Ambulatory Visit: Payer: Self-pay | Admitting: Internal Medicine

## 2015-08-09 ENCOUNTER — Encounter: Payer: Self-pay | Admitting: Emergency Medicine

## 2015-08-24 DIAGNOSIS — R972 Elevated prostate specific antigen [PSA]: Secondary | ICD-10-CM | POA: Diagnosis not present

## 2015-08-30 DIAGNOSIS — R972 Elevated prostate specific antigen [PSA]: Secondary | ICD-10-CM | POA: Diagnosis not present

## 2015-08-30 DIAGNOSIS — N138 Other obstructive and reflux uropathy: Secondary | ICD-10-CM | POA: Diagnosis not present

## 2015-08-30 DIAGNOSIS — N401 Enlarged prostate with lower urinary tract symptoms: Secondary | ICD-10-CM | POA: Diagnosis not present

## 2015-11-01 DIAGNOSIS — H43811 Vitreous degeneration, right eye: Secondary | ICD-10-CM | POA: Diagnosis not present

## 2015-11-14 ENCOUNTER — Ambulatory Visit (INDEPENDENT_AMBULATORY_CARE_PROVIDER_SITE_OTHER): Payer: Medicare Other | Admitting: Family Medicine

## 2015-11-14 VITALS — BP 136/92 | HR 75 | Temp 97.9°F | Resp 16 | Ht 71.0 in | Wt 190.0 lb

## 2015-11-14 DIAGNOSIS — H9192 Unspecified hearing loss, left ear: Secondary | ICD-10-CM | POA: Diagnosis not present

## 2015-11-14 MED ORDER — PREDNISONE 20 MG PO TABS: ORAL_TABLET | ORAL | Status: DC

## 2015-11-14 NOTE — Progress Notes (Signed)
[facility] [address], [place] [id number] [phone]  Date:  11/14/2015   Name:  Austin Moore   DOB:  11-Dec-1945   MRN:  [id number]  PCP:  Lucilla EdinAUB, STEVE A, MD    Chief Complaint: Ear Fullness   History of Present Illness:  Austin Moore is a 70 y.o. very pleasant male patient who presents with the following:  He feels like his ears are "Blocked up," he is having a hard time hearing and feels a pressure in his ear.  He cannot hear normally. The left is worse.   He is otherwise feeling well today He has noted a cold since about Christmas- he noted a feeling of a sinus infection, the left ear has been feeling clogged.  He has noted that he cannot hear as well, and he has noted long term tinnitus. He does not really feel  Patient Active Problem List   Diagnosis Date Noted  . Erectile dysfunction 08/11/2013  . LBBB (left bundle branch block) 05/26/2013  . Hyperlipidemia 05/26/2013  . Family history of early CAD 05/26/2013    Past Medical History  Diagnosis Date  . Hyperlipidemia     intolerant to statins - elevated LFTs  . Family history of heart disease   . LBBB (left bundle branch block)   . History of nuclear stress test 02/17/2009    dipyridamole; negative for ischemia, EF 68% - r/t LBBB    Past Surgical History  Procedure Laterality Date  . Biospy  age [age] - 1968    neck  . Cardiac catheterization  10/1994    after false positive Thallium test - normal L main/LAD; L Cfx is large dominant vessel, RCA non-dominant (Dr. Langston ReusingA. Little)    Social History  Substance Use Topics  . Smoking status: Former Smoker -- 6 years    Types: Cigarettes    Quit date: 05/26/1970  . Smokeless tobacco: Former NeurosurgeonUser  . Alcohol Use: 2.5 oz/week    5 drink(s) per week    Family History  Problem Relation Age of Onset  . Cancer Mother   . Heart disease Mother   . Heart attack Father [id number]  . Heart disease Father   . Cancer Sister     Allergies  Allergen  Reactions  . Peanut-Containing Drug Products Shortness Of Breath    Feels mild shortness of breath  . Lipitor [Atorvastatin]     Elevated liver enzymes  . Vytorin [Ezetimibe-Simvastatin]     Elevated liver enzymes  . Zocor [Simvastatin]     Elevated liver enzymes    Medication list has been reviewed and updated.  Current Outpatient Prescriptions on File Prior to Visit  Medication Sig Dispense Refill  . aspirin 81 MG tablet Take 81 mg by mouth daily.    Marland Kitchen. atorvastatin (LIPITOR) 20 MG tablet TAKE 1 TABLET (20 MG TOTAL) BY MOUTH DAILY. 90 tablet 2  . ezetimibe (ZETIA) 10 MG tablet Take 1 tablet (10 mg total) by mouth daily. (Patient taking differently: Take 10 mg by mouth daily. Takes 3-4x/week) 30 tablet 6  . fish oil-omega-3 fatty acids 1000 MG capsule Take by mouth every other day.     Marland Kitchen. MAGNESIUM PO Take 1 capsule by mouth daily.    . Cholecalciferol (VITAMIN D-3 PO) Take by mouth daily. Reported on 11/14/2015     No current facility-administered medications on file prior to visit.    Review of Systems:  As per HPI-  otherwise negative.   Physical Examination: Filed Vitals:   11/14/15 1037  BP: 136/92  Pulse: 75  Temp: 97.9 F (36.6 C)  Resp: 16   Filed Vitals:   11/14/15 1037  Height: 5\' 11"  (1.803 m)  Weight: 190 lb (86.183 kg)   Body mass index is 26.51 kg/(m^2). Ideal Body Weight: Weight in (lb) to have BMI = 25: 178.9  GEN: WDWN, NAD, Non-toxic, A & O x , appears well HEENT: Atraumatic, Normocephalic. Neck supple. No masses, No LAD. Bilateral TM wnl, oropharynx normal.  PEERL,EOMI.   No physical blockage of the ear canal is present but he does appear to have a clear middle ear effusion Ears and Nose: No external deformity. CV: RRR, No M/G/R. No JVD. No thrill. No extra heart sounds. PULM: CTA B, no wheezes, crackles, rhonchi. No retractions. No resp. distress. No accessory muscle use. ABD: S, NT, ND, +BS. No rebound. No HSM. EXTR: No c/c/e NEURO Normal gait.   PSYCH: Normally interactive. Conversant. Not depressed or anxious appearing.  Calm demeanor.  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 Gave rx for prednisone as below.  Called and spoke with ENT on call dr. Suszanne Conners- he would like to see pt today.  Called pt and gave him details, he will proceed to Dr. Avel Sensor office     Signed Abbe Amsterdam, MD

## 2015-11-14 NOTE — Patient Instructions (Signed)
Your hearing loss does not appear to be due to anything clogging your ear canal.  We are going to try a course of prednisone to try and get any fluid out of your middle ear.  I will also call ENT and make sure they do not need to see you more urgently, and assuming not will plan for a follow-up visit with them in a week or so

## 2016-03-27 ENCOUNTER — Telehealth: Payer: Self-pay | Admitting: Internal Medicine

## 2016-03-27 DIAGNOSIS — E785 Hyperlipidemia, unspecified: Secondary | ICD-10-CM

## 2016-03-27 DIAGNOSIS — Z79899 Other long term (current) drug therapy: Secondary | ICD-10-CM

## 2016-03-27 NOTE — Telephone Encounter (Signed)
Returned call to pt.  Pt stated he normally gets lab work prior to his appt with Dr Rennis GoldenHilty. Looks like Dr Rennis GoldenHilty normally orders lipid and CMET. These labs ordered and pt aware to come to lab on first floor to have them drawn within a few weeks prior to appt and to be fasting.

## 2016-03-27 NOTE — Telephone Encounter (Signed)
NeWMessage  Pt calling to discuss possible lab work to do before appt in June w/ Dr Rennis Goldenhilty- has order for lipid test- pt wants details on how to do the labs @ [facility]. Please call back and discuss.

## 2016-04-10 ENCOUNTER — Other Ambulatory Visit: Payer: Self-pay | Admitting: Internal Medicine

## 2016-04-10 NOTE — Telephone Encounter (Signed)
Rx(s) sent to pharmacy electronically.  

## 2016-04-25 LAB — COMPREHENSIVE METABOLIC PANEL
ALT: 17 U/L (ref 9–46)
AST: 25 U/L (ref 10–35)
Albumin: 3.9 g/dL (ref 3.6–5.1)
Alkaline Phosphatase: 74 U/L (ref 40–115)
BUN: 15 mg/dL (ref 7–25)
CHLORIDE: 105 mmol/L (ref 98–110)
CO2: 25 mmol/L (ref 20–31)
Calcium: 9 mg/dL (ref 8.6–10.3)
Creat: 1.04 mg/dL (ref 0.70–1.18)
Glucose, Bld: 90 mg/dL (ref 65–99)
POTASSIUM: 4.5 mmol/L (ref 3.5–5.3)
Sodium: 140 mmol/L (ref 135–146)
Total Bilirubin: 0.6 mg/dL (ref 0.2–1.2)
Total Protein: 6.3 g/dL (ref 6.1–8.1)

## 2016-04-29 LAB — CARDIO IQ(R) ADVANCED LIPID PANEL
Apolipoprotein B: 84 mg/dL (ref 52–109)
CHOLESTEROL/HDL RATIO (CARDIO IQ ADV LIPID PANEL): 3.6 calc (ref <=5.0)
Cholesterol, Total: 147 mg/dL (ref 125–200)
HDL CHOLESTEROL (CARDIO IQ ADV LIPID PANEL): 41 mg/dL (ref >=40)
LDL Large: 4400 nmol/L (ref 4334–10815)
LDL Medium: 311 nmol/L (ref 167–465)
LDL Particle Number: 1255 nmol/L (ref 1016–2185)
LDL Peak Size: 215 Angstrom — ABNORMAL LOW (ref >=218.2)
LDL Small: 272 nmol/L (ref 123–441)
LDL, Calculated: 91 mg/dL
LIPOPROTEIN (A) (CARDIO IQ ADV LIPID PANEL): 53 nmol/L (ref <75)
NON-HDL CHOLESTEROL (CARDIO IQ ADV LIPID PANEL): 106 mg/dL
TRIGLYCERIDES (CARDIO IQ ADV LIPID PANEL): 74 mg/dL

## 2016-05-01 ENCOUNTER — Encounter: Payer: Self-pay | Admitting: Internal Medicine

## 2016-05-01 ENCOUNTER — Ambulatory Visit (INDEPENDENT_AMBULATORY_CARE_PROVIDER_SITE_OTHER): Payer: Medicare Other | Admitting: Internal Medicine

## 2016-05-01 VITALS — BP 140/85 | HR 68 | Ht 70.0 in | Wt 189.8 lb

## 2016-05-01 DIAGNOSIS — E785 Hyperlipidemia, unspecified: Secondary | ICD-10-CM

## 2016-05-01 DIAGNOSIS — Z79899 Other long term (current) drug therapy: Secondary | ICD-10-CM | POA: Insufficient documentation

## 2016-05-01 DIAGNOSIS — H61892 Other specified disorders of left external ear: Secondary | ICD-10-CM

## 2016-05-01 DIAGNOSIS — H61899 Other specified disorders of external ear, unspecified ear: Secondary | ICD-10-CM | POA: Insufficient documentation

## 2016-05-01 DIAGNOSIS — I447 Left bundle-branch block, unspecified: Secondary | ICD-10-CM

## 2016-05-01 NOTE — Patient Instructions (Signed)
Medication Instructions:  Continue current medications  Labwork: BMP and Lipids  Testing/Procedures: NONE  Follow-Up: Your physician wants you to follow-up in: 1 Year. You will receive a reminder letter in the mail two months in advance. If you don't receive a letter, please call our office to schedule the follow-up appointment.   Any Other Special Instructions Will Be Listed Below (If Applicable).  If you need a refill on your cardiac medications before your next appointment, please call your pharmacy.

## 2016-05-01 NOTE — Progress Notes (Signed)
OFFICE NOTE  Chief Complaint:  Routine followup  Primary Care Physician: Lucilla EdinAUB, STEVE A, MD  HPI:  Austin Moore is a pleasant 70 yo male with a strong family history of heart disease with his father having his 1st cardiac event at age 70. In addition to that, he has hyperlipidemia and has been intolerant to statins in the past because of elevated liver functions. He had been able to take Lipitor 10 mg tablets 3 times a week with no change in his liver functions and no myalgias. His blood pressure which is usually under good control away from this office still remains borderline high. He is completely asymptomatic. He has had no chest pain, no unusual shortness of breath, and no evidence of any arrhythmias. He developed a left bundle branch block in 2010 and as a result of that had a stress test performed in April 2010 that was negative for ischemia and showed a 68% ejection fraction. In 2011 the left bundle branch block had resolved and today he again has his left bundle branch block. It seemed to be intermittent but clearly does not appear to be ischemic mediated based on his stress test 2 years ago.   At his last visit, I recommended adding low-dose Lipitor and Zetia back to his regimen. This is do to an elevated lipid profile with him an MR that demonstrated an LDL particle number of 2700. His LDL C. at that time was 213. Over the past 3 months he's increased his exercise as noted a marked improvement in his energy level. He says he's been tolerating his medications and has had no problems with muscle pain or soreness. His recent lipid profile demonstrated total cholesterol 135, triglycerides 77, HDL 39 and LDL 81, a marked improvement in his lipid profile. The remainder of his labs including a CK was normal at 95 an AST and ALT were both within normal limits. Does not appear that he is having the liver enzyme abnormalities she had in the past. He is very pleased with his current medicine  regimen.  Austin Moore returns today for followup of his lipid profile.  There's been a slight increase in cholesterol since his last cholesterol test however LDL particle number is improved. Currently his LDL-P is 1483, down from 2792.  LDL C. is 99, down from 213. Total cholesterol is 148. This demonstrated marked improvement over his previous blood work which is in part due to his exercise, diet and the addition of Zetia. I've been hesitant to increase the dose of Lipitor due to his abnormal liver function and problems with statins in the past. He seems to be tolerating the medication combination currently without side effects. Comprehensive metabolic profile shows normal AST and ALT as well as renal function.  I saw Austin Moore acted in the office. Overall he is doing well denies any chest discomfort or worsening shortness of breath. He's had a pretty good control of his lipid profile but takes Lipitor only 3-4 times a week as well as Zetia. EKG shows a stable left bundle branch block.  05/01/2016  Austin Moore was seen in the office today in follow-up. Overall he seems to be doing quite well. He continues to be active and denies any chest pain or worsening shortness of breath. He reports she's not been as diligent with his medications for cholesterol however a repeat cholesterol profile was drawn today and shows an improvement with an LDL-P of 1255, total cholesterol 147, HDL-C 41,  triglycerides 74 and LDL-C 91. He seems to have a stable left bundle branch block. He also reported some intermittent numbness and tingling around the left ear recently.  PMHx:  Past Medical History  Diagnosis Date  . Hyperlipidemia     intolerant to statins - elevated LFTs  . Family history of heart disease   . LBBB (left bundle branch block)   . History of nuclear stress test 02/17/2009    dipyridamole; negative for ischemia, EF 68% - r/t LBBB    Past Surgical History  Procedure Laterality Date  . Biospy  age 70  - 1968    neck  . Cardiac catheterization  10/1994    after false positive Thallium test - normal L main/LAD; L Cfx is large dominant vessel, RCA non-dominant (Dr. Langston ReusingA. Little)    FAMHx:  Family History  Problem Relation Age of Onset  . Cancer Mother   . Heart disease Mother   . Heart attack Father [id number]  . Heart disease Father   . Cancer Sister     SOCHx:   reports that he quit smoking about 45 years ago. His smoking use included Cigarettes. He quit after 6 years of use. He has quit using smokeless tobacco. He reports that he drinks about 2.5 oz of alcohol per week. He reports that he does not use illicit drugs.  ALLERGIES:  Allergies  Allergen Reactions  . Peanut-Containing Drug Products Shortness Of Breath    Feels mild shortness of breath  . Lipitor [Atorvastatin]     Elevated liver enzymes  . Vytorin [Ezetimibe-Simvastatin]     Elevated liver enzymes  . Zocor [Simvastatin]     Elevated liver enzymes    ROS: A comprehensive review of systems was negative.  HOME MEDS: Current Outpatient Prescriptions  Medication Sig Dispense Refill  . aspirin 81 MG tablet Take 81 mg by mouth daily.    Marland Kitchen. atorvastatin (LIPITOR) 20 MG tablet TAKE 1 TABLET (20 MG TOTAL) BY MOUTH DAILY. 90 tablet 2  . Cholecalciferol (VITAMIN D-3 PO) Take by mouth daily. Reported on 11/14/2015    . ezetimibe (ZETIA) 10 MG tablet TAKE 1 TABLET BY MOUTH DAILY. 30 tablet 6  . fish oil-omega-3 fatty acids 1000 MG capsule Take by mouth every other day.     Marland Kitchen. MAGNESIUM PO Take 1 capsule by mouth as needed.      No current facility-administered medications for this visit.    LABS/IMAGING: No results found for this or any previous visit (from the past 48 hour(s)). No results found.  VITALS: BP 140/85 mmHg  Pulse 68  Ht 5\' 10"  (1.778 m)  Wt 189 lb 12.8 oz (86.093 kg)  BMI 27.23 kg/m2  EXAM: General appearance: alert and no distress Neck: no carotid bruit, no JVD and Left otic canal without cerumen and some  dryness of the ear canal, intact tympanic membrane Lungs: clear to auscultation bilaterally Heart: regular rate and rhythm, S1, S2 normal, no murmur, click, rub or gallop Abdomen: soft, non-tender; bowel sounds normal; no masses,  no organomegaly Extremities: extremities normal, atraumatic, no cyanosis or edema Pulses: 2+ and symmetric Skin: Skin color, texture, turgor normal. No rashes or lesions Neurologic: Grossly normal Psych: Pleasant  EKG: Normal sinus rhythm at 63, LBBB  ASSESSMENT: 1. Stable LBBB 2. Hyperlipidemia - significantly improved 3. Strong family history of premature coronary disease 4. Tolerating Lipitor and Zetia, despite a prior history of statin intolerance due to myalgias and elevated LFTs (Lipitor, Zocor, Vytorin, Crestor)  PLAN: 1.   Austin Moore seems to be doing quite well with an improved lipid profile. We'll continue his current medications. His left bundle branch block is stable and not wider. He is asymptomatic. He does have a small amount of dryness of the left ear canal and this could be related to his tingling around the left ear. I recommended the proximal eardrops and/or small amount of Vaseline in the ear.  Follow-up with me annually or sooner as necessary.  Chrystie Nose, MD, [facility] Attending Cardiologist [facility]  Lisette Abu Anum Palecek 05/01/2016, 8:22 AM

## 2016-05-15 NOTE — Addendum Note (Signed)
Addended by: Freddi StarrMATHIS, DEBRA W on: 05/15/2016 08:04 AM   Modules accepted: Orders, SmartSet

## 2016-07-01 ENCOUNTER — Encounter (HOSPITAL_COMMUNITY): Payer: Self-pay | Admitting: Emergency Medicine

## 2016-07-01 ENCOUNTER — Ambulatory Visit (HOSPITAL_COMMUNITY)
Admission: EM | Admit: 2016-07-01 | Discharge: 2016-07-01 | Disposition: A | Discharge status: Home / Self Care | Payer: Medicare Other | Attending: Emergency Medicine | Admitting: Emergency Medicine

## 2016-07-01 DIAGNOSIS — H9201 Otalgia, right ear: Secondary | ICD-10-CM

## 2016-07-01 DIAGNOSIS — H6121 Impacted cerumen, right ear: Secondary | ICD-10-CM | POA: Diagnosis not present

## 2016-07-01 DIAGNOSIS — H6091 Unspecified otitis externa, right ear: Secondary | ICD-10-CM

## 2016-07-01 MED ORDER — NEOMYCIN-POLYMYXIN-HC 3.5-10000-1 OT SUSP: 4.0000 [drp] | Freq: Four times a day (QID) | OTIC | 0 refills | Status: DC

## 2016-07-01 NOTE — ED Provider Notes (Signed)
CSN: [id number]     Arrival date & time 07/01/16  1200 History   First MD Initiated Contact with Patient 07/01/16 1253     Chief Complaint  Patient presents with  . Otalgia   (Consider location/radiation/quality/duration/timing/severity/associated sxs/prior Treatment) 70 year old male presents with soreness and pain to the right ear for about 4 days. Some decrease in hearing. He is experiencing swelling and drainage from the right ear. He has been using Q-tips, pouring hydrogen peroxide in the ear and stuffing Neosporin ointment in the ear canal. He denies fever, chills, discomfort or symptoms to the left ear, sore throat or other upper respiratory symptoms.      Past Medical History:  Diagnosis Date  . Family history of heart disease   . History of nuclear stress test 02/17/2009   dipyridamole; negative for ischemia, EF 68% - r/t LBBB  . Hyperlipidemia    intolerant to statins - elevated LFTs  . LBBB (left bundle branch block)    Past Surgical History:  Procedure Laterality Date  . biospy  age 70 - 1968   neck  . CARDIAC CATHETERIZATION  10/1994   after false positive Thallium test - normal L main/LAD; L Cfx is large dominant vessel, RCA non-dominant (Dr. Langston ReusingA. Little)   Family History  Problem Relation Age of Onset  . Cancer Mother   . Heart disease Mother   . Heart attack Father [id number]  . Heart disease Father   . Cancer Sister    Social History  Substance Use Topics  . Smoking status: Former Smoker    Years: 6.00    Types: Cigarettes    Quit date: 05/26/1970  . Smokeless tobacco: Former NeurosurgeonUser  . Alcohol use 2.5 oz/week    5 drink(s) per week    Review of Systems  Constitutional: Negative for activity change, diaphoresis, fatigue and fever.  HENT: Positive for ear discharge and ear pain. Negative for congestion, facial swelling, mouth sores, sinus pressure, sneezing and sore throat.   Eyes: Negative.   Respiratory: Negative.   Cardiovascular: Negative.   Skin: Negative.    All other systems reviewed and are negative.   Allergies  Peanut-containing drug products; Lipitor [atorvastatin]; Vytorin [ezetimibe-simvastatin]; and Zocor [simvastatin]  Home Medications   Prior to Admission medications   Medication Sig Start Date End Date Taking? Authorizing Provider  aspirin 81 MG tablet Take 81 mg by mouth daily.   Yes Historical Provider, MD  atorvastatin (LIPITOR) 20 MG tablet TAKE 1 TABLET (20 MG TOTAL) BY MOUTH DAILY. 05/10/15  Yes Chrystie NoseKenneth C Hilty, MD  ezetimibe (ZETIA) 10 MG tablet TAKE 1 TABLET BY MOUTH DAILY. 04/10/16  Yes Chrystie NoseKenneth C Hilty, MD  Cholecalciferol (VITAMIN D-3 PO) Take by mouth daily. Reported on 11/14/2015    Historical Provider, MD  fish oil-omega-3 fatty acids 1000 MG capsule Take by mouth every other day.     Historical Provider, MD  MAGNESIUM PO Take 1 capsule by mouth as needed.     Historical Provider, MD  neomycin-polymyxin-hydrocortisone (CORTISPORIN) 3.5-10000-1 otic suspension Place 4 drops into the right ear 4 (four) times daily. X 7 days 07/01/16   Hayden Rasmussenavid Dublin Cantero, NP   Meds Ordered and Administered this Visit  Medications - No data to display  BP 165/100 (BP Location: Right Arm)   Pulse 66   Temp 98.2 F (36.8 C) (Oral)   Resp 16   SpO2 98%  No data found.   Physical Exam  Constitutional: He is oriented to person, place, and time.  He appears well-developed and well-nourished. No distress.  HENT:  Left ear, TM and EAC is normal. Right EAC with swelling, moisture to the walls of the canal, globs of what appears to be softened earwax and material possibly identified as petrolatum. A small portion of the TM is seen, no obvious erythema.. The EAC is mildly swollen and narrowed.  Eyes: EOM are normal.  Neck: Neck supple.  Cardiovascular: Normal rate.   Pulmonary/Chest: Effort normal. No respiratory distress.  Musculoskeletal: He exhibits no edema.  Neurological: He is alert and oriented to person, place, and time. He exhibits normal  muscle tone.  Skin: Skin is warm and dry.  Psychiatric: He has a normal mood and affect.  Nursing note and vitals reviewed.   Urgent Care Course   Clinical Course    Procedures (including critical care time)  Labs Review Labs Reviewed - No data to display  Imaging Review No results found.   Visual Acuity Review  Right Eye Distance:   Left Eye Distance:   Bilateral Distance:    Right Eye Near:   Left Eye Near:    Bilateral Near:         MDM   1. Otitis externa, right   2. Otalgia, right   3. Cerumen impaction, right    Post irrigation the right EAC is clear. EAC with mild swelling and erythema. The TM with minor spotty erythema likely due to irrigation process. Doubt middle ear infection. Meds ordered this encounter  Medications  . neomycin-polymyxin-hydrocortisone (CORTISPORIN) 3.5-10000-1 otic suspension    Sig: Place 4 drops into the right ear 4 (four) times daily. X 7 days    Dispense:  7.5 mL    Refill:  0    Order Specific Question:   Supervising Provider    Answer:   Eustace Moore [[id number]]   Patient received verbal and written instructions by the provider.    Hayden Rasmussen, NP 07/01/16 1407    Hayden Rasmussen, NP 07/01/16 1410

## 2016-07-01 NOTE — Discharge Instructions (Signed)
Apply your drops as directed.

## 2016-07-01 NOTE — ED Triage Notes (Signed)
The patient presented to the [facility] with a complaint of right ear pain x 4 days. 

## 2016-11-01 ENCOUNTER — Encounter: Payer: Self-pay | Admitting: Family Medicine

## 2016-11-01 DIAGNOSIS — R972 Elevated prostate specific antigen [PSA]: Secondary | ICD-10-CM | POA: Insufficient documentation

## 2017-02-07 ENCOUNTER — Other Ambulatory Visit: Payer: Self-pay | Admitting: Internal Medicine

## 2017-02-07 NOTE — Telephone Encounter (Signed)
REFILL 

## 2017-05-28 ENCOUNTER — Ambulatory Visit (INDEPENDENT_AMBULATORY_CARE_PROVIDER_SITE_OTHER): Payer: Medicare Other | Admitting: Internal Medicine

## 2017-05-28 ENCOUNTER — Encounter: Payer: Self-pay | Admitting: Internal Medicine

## 2017-05-28 VITALS — BP 120/80 | HR 65 | Ht 70.0 in | Wt 186.8 lb

## 2017-05-28 DIAGNOSIS — E785 Hyperlipidemia, unspecified: Secondary | ICD-10-CM | POA: Diagnosis not present

## 2017-05-28 DIAGNOSIS — I447 Left bundle-branch block, unspecified: Secondary | ICD-10-CM | POA: Diagnosis not present

## 2017-05-28 NOTE — Addendum Note (Signed)
Addended by: Regis BillPRATT, Rosely Fernandez B on: 05/28/2017 02:29 PM   Modules accepted: Orders

## 2017-05-28 NOTE — Progress Notes (Signed)
OFFICE NOTE  Chief Complaint:  Routine followup  Primary Care Physician: Collene Gobbleaub, Steven A, MD  HPI:  Austin Moore is a pleasant 71 yo male with a strong family history of heart disease with his father having his 1st cardiac event at age 71. In addition to that, he has hyperlipidemia and has been intolerant to statins in the past because of elevated liver functions. He had been able to take Lipitor 10 mg tablets 3 times a week with no change in his liver functions and no myalgias. His blood pressure which is usually under good control away from this office still remains borderline high. He is completely asymptomatic. He has had no chest pain, no unusual shortness of breath, and no evidence of any arrhythmias. He developed a left bundle branch block in 2010 and as a result of that had a stress test performed in April 2010 that was negative for ischemia and showed a 68% ejection fraction. In 2011 the left bundle branch block had resolved and today he again has his left bundle branch block. It seemed to be intermittent but clearly does not appear to be ischemic mediated based on his stress test 2 years ago.   At his last visit, I recommended adding low-dose Lipitor and Zetia back to his regimen. This is do to an elevated lipid profile with him an MR that demonstrated an LDL particle number of 2700. His LDL C. at that time was 213. Over the past 3 months he's increased his exercise as noted a marked improvement in his energy level. He says he's been tolerating his medications and has had no problems with muscle pain or soreness. His recent lipid profile demonstrated total cholesterol 135, triglycerides 77, HDL 39 and LDL 81, a marked improvement in his lipid profile. The remainder of his labs including a CK was normal at 95 an AST and ALT were both within normal limits. Does not appear that he is having the liver enzyme abnormalities she had in the past. He is very pleased with his current medicine  regimen.  Austin Moore returns today for followup of his lipid profile.  There's been a slight increase in cholesterol since his last cholesterol test however LDL particle number is improved. Currently his LDL-P is 1483, down from 2792.  LDL C. is 99, down from 213. Total cholesterol is 148. This demonstrated marked improvement over his previous blood work which is in part due to his exercise, diet and the addition of Zetia. I've been hesitant to increase the dose of Lipitor due to his abnormal liver function and problems with statins in the past. He seems to be tolerating the medication combination currently without side effects. Comprehensive metabolic profile shows normal AST and ALT as well as renal function.  I saw Austin Moore acted in the office. Overall he is doing well denies any chest discomfort or worsening shortness of breath. He's had a pretty good control of his lipid profile but takes Lipitor only 3-4 times a week as well as Zetia. EKG shows a stable left bundle branch block.  05/01/2016  Austin Moore was seen in the office today in follow-up. Overall he seems to be doing quite well. He continues to be active and denies any chest pain or worsening shortness of breath. He reports she's not been as diligent with his medications for cholesterol however a repeat cholesterol profile was drawn today and shows an improvement with an LDL-P of 1255, total cholesterol 147, HDL-C 41,  triglycerides 74 and LDL-C 91. He seems to have a stable left bundle branch block. He also reported some intermittent numbness and tingling around the left ear recently.  05/28/2017  Austin Moore was seen today in follow-up. He says he feels fantastic urine he denies any chest pain or worsening shortness of breath. He's recently been decreasing his atorvastatin and ezetimibe for unknown reasons. He says he takes it probably 2 or 3 times a week together. He felt like if he took the medicine more frequently he would have side  effects. Initially blood pressure was elevated 144/88, however recheck was 120/80. He reports being physically active and again is asymptomatic. His EKG shows a stable left bundle branch block  PMHx:  Past Medical History:  Diagnosis Date  . Family history of heart disease   . History of nuclear stress test 02/17/2009   dipyridamole; negative for ischemia, EF 68% - r/t LBBB  . Hyperlipidemia    intolerant to statins - elevated LFTs  . LBBB (left bundle branch block)     Past Surgical History:  Procedure Laterality Date  . biospy  age 71 - 1968   neck  . CARDIAC CATHETERIZATION  10/1994   after false positive Thallium test - normal L main/LAD; L Cfx is large dominant vessel, RCA non-dominant (Dr. Langston Reusing)    FAMHx:  Family History  Problem Relation Age of Onset  . Cancer Mother   . Heart disease Mother   . Heart attack Father 10  . Heart disease Father   . Cancer Sister     SOCHx:   reports that he quit smoking about 47 years ago. His smoking use included Cigarettes. He quit after 6.00 years of use. He has quit using smokeless tobacco. He reports that he drinks about 2.5 oz of alcohol per week . He reports that he does not use drugs.  ALLERGIES:  Allergies  Allergen Reactions  . Peanut-Containing Drug Products Shortness Of Breath    Feels mild shortness of breath  . Lipitor [Atorvastatin]     Elevated liver enzymes  . Vytorin [Ezetimibe-Simvastatin]     Elevated liver enzymes  . Zocor [Simvastatin]     Elevated liver enzymes    ROS: Pertinent items noted in HPI and remainder of comprehensive ROS otherwise negative.  HOME MEDS: Current Outpatient Prescriptions  Medication Sig Dispense Refill  . aspirin 81 MG tablet Take 81 mg by mouth daily.    Marland Kitchen atorvastatin (LIPITOR) 20 MG tablet TAKE 1 TABLET BY MOUTH DAILY. 90 tablet 1  . Cholecalciferol (VITAMIN D-3 PO) Take by mouth daily. Reported on 11/14/2015    . ezetimibe (ZETIA) 10 MG tablet TAKE 1 TABLET BY MOUTH  DAILY. 30 tablet 6  . fish oil-omega-3 fatty acids 1000 MG capsule Take by mouth every other day.     Marland Kitchen MAGNESIUM PO Take 1 capsule by mouth as needed.     . neomycin-polymyxin-hydrocortisone (CORTISPORIN) 3.5-10000-1 otic suspension Place 4 drops into the right ear 4 (four) times daily. X 7 days 7.5 mL 0   No current facility-administered medications for this visit.     LABS/IMAGING: No results found for this or any previous visit (from the past 48 hour(s)). No results found.  VITALS: BP 120/80   Pulse 65   Ht 5\' 10"  (1.778 m)   Wt 186 lb 12.8 oz (84.7 kg)   BMI 26.80 kg/m   EXAM: General appearance: alert and no distress Neck: no carotid bruit, no JVD and thyroid  not enlarged, symmetric, no tenderness/mass/nodules Lungs: clear to auscultation bilaterally Heart: regular rate and rhythm, S1, S2 normal, no murmur, click, rub or gallop Abdomen: soft, non-tender; bowel sounds normal; no masses,  no organomegaly Extremities: extremities normal, atraumatic, no cyanosis or edema Pulses: 2+ and symmetric Skin: Skin color, texture, turgor normal. No rashes or lesions Neurologic: Grossly normal Psych: Pleasant  EKG: Normal sinus rhythm 65, LBBB-personally reviewed  ASSESSMENT: 1. Stable LBBB 2. Hyperlipidemia - significantly improved 3. Strong family history of premature coronary disease 4. Tolerating Lipitor and Zetia, despite a prior history of statin intolerance due to myalgias and elevated LFTs (Lipitor, Zocor, Vytorin, Crestor)  PLAN: 1.   Mr. Kassis she is to be doing well his asymptomatic. He reports taking less frequent doses of his Lipitor. We'll plan to recheck of his lipid profile. I expect his cholesterol to be somewhat higher although he's had a little bit of weight loss recently. If he still not at goal then I would advocate taking ezetimibe daily which I doubt will cause him any worsening myalgias and plan a repeat lipid profile in about 3 months. Follow-up with me  annually or sooner as necessary.  Chrystie Nose, MD, [facility] Attending Cardiologist [facility]  Lisette Abu Fawn Desrocher 05/28/2017, 1:07 PM

## 2017-05-28 NOTE — Patient Instructions (Addendum)
Medication Instructions:  Your physician recommends that you continue on your current medications as directed. Please refer to the Current Medication list given to you today.   Labwork: FASTING LP SOON  Testing/Procedures: NONE  Follow-Up: Your physician wants you to follow-up in: 1 YEAR OV You will receive a reminder letter in the mail two months in advance. If you don't receive a letter, please call our office to schedule the follow-up appointment.  If you need a refill on your cardiac medications before your next appointment, please call your pharmacy.

## 2017-05-30 LAB — LIPID PANEL
CHOLESTEROL TOTAL: 152 mg/dL (ref 100–199)
Chol/HDL Ratio: 3.5 ratio (ref 0.0–5.0)
HDL: 44 mg/dL (ref >39)
LDL CALC: 93 mg/dL (ref 0–99)
Triglycerides: 75 mg/dL (ref 0–149)
VLDL Cholesterol Cal: 15 mg/dL (ref 5–40)

## 2017-05-31 ENCOUNTER — Encounter: Payer: Self-pay | Admitting: Internal Medicine

## 2018-06-24 ENCOUNTER — Encounter: Payer: Self-pay | Admitting: Internal Medicine

## 2018-06-24 ENCOUNTER — Ambulatory Visit: Payer: Medicare Other | Admitting: Internal Medicine

## 2018-06-24 VITALS — BP 148/82 | HR 68 | Ht 70.5 in | Wt 184.0 lb

## 2018-06-24 DIAGNOSIS — E785 Hyperlipidemia, unspecified: Secondary | ICD-10-CM | POA: Diagnosis not present

## 2018-06-24 DIAGNOSIS — I447 Left bundle-branch block, unspecified: Secondary | ICD-10-CM | POA: Diagnosis not present

## 2018-06-24 NOTE — Patient Instructions (Signed)
Your physician recommends that you return for lab work FASTING to check cholesterol   Your physician wants you to follow-up in: ONE YEAR with Dr. Hilty. You will receive a reminder letter in the mail two months in advance. If you don't receive a letter, please call our office to schedule the follow-up appointment.   

## 2018-06-24 NOTE — Progress Notes (Signed)
OFFICE NOTE  Chief Complaint:  No complaints  Primary Care Physician: Merlene Laughter, MD  HPI:  Austin Moore is a pleasant 72 yo male with a strong family history of heart disease with his father having his 1st cardiac event at age 77. In addition to that, he has hyperlipidemia and has been intolerant to statins in the past because of elevated liver functions. He had been able to take Lipitor 10 mg tablets 3 times a week with no change in his liver functions and no myalgias. His blood pressure which is usually under good control away from this office still remains borderline high. He is completely asymptomatic. He has had no chest pain, no unusual shortness of breath, and no evidence of any arrhythmias. He developed a left bundle branch block in 2010 and as a result of that had a stress test performed in April 2010 that was negative for ischemia and showed a 68% ejection fraction. In 2011 the left bundle branch block had resolved and today he again has his left bundle branch block. It seemed to be intermittent but clearly does not appear to be ischemic mediated based on his stress test 2 years ago.   At his last visit, I recommended adding low-dose Lipitor and Zetia back to his regimen. This is do to an elevated lipid profile with him an MR that demonstrated an LDL particle number of 2700. His LDL C. at that time was 213. Over the past 3 months he's increased his exercise as noted a marked improvement in his energy level. He says he's been tolerating his medications and has had no problems with muscle pain or soreness. His recent lipid profile demonstrated total cholesterol 135, triglycerides 77, HDL 39 and LDL 81, a marked improvement in his lipid profile. The remainder of his labs including a CK was normal at 95 an AST and ALT were both within normal limits. Does not appear that he is having the liver enzyme abnormalities she had in the past. He is very pleased with his current medicine  regimen.  Austin Moore returns today for followup of his lipid profile.  There's been a slight increase in cholesterol since his last cholesterol test however LDL particle number is improved. Currently his LDL-P is 1483, down from 2792.  LDL C. is 99, down from 213. Total cholesterol is 148. This demonstrated marked improvement over his previous blood work which is in part due to his exercise, diet and the addition of Zetia. I've been hesitant to increase the dose of Lipitor due to his abnormal liver function and problems with statins in the past. He seems to be tolerating the medication combination currently without side effects. Comprehensive metabolic profile shows normal AST and ALT as well as renal function.  I saw Austin Moore acted in the office. Overall he is doing well denies any chest discomfort or worsening shortness of breath. He's had a pretty good control of his lipid profile but takes Lipitor only 3-4 times a week as well as Zetia. EKG shows a stable left bundle branch block.  05/01/2016  Austin Moore was seen in the office today in follow-up. Overall he seems to be doing quite well. He continues to be active and denies any chest pain or worsening shortness of breath. He reports she's not been as diligent with his medications for cholesterol however a repeat cholesterol profile was drawn today and shows an improvement with an LDL-P of 1255, total cholesterol 147, HDL-C 41, triglycerides  74 and LDL-C 91. He seems to have a stable left bundle branch block. He also reported some intermittent numbness and tingling around the left ear recently.  05/28/2017  Austin Moore was seen today in follow-up. He says he feels fantastic urine he denies any chest pain or worsening shortness of breath. He's recently been decreasing his atorvastatin and ezetimibe for unknown reasons. He says he takes it probably 2 or 3 times a week together. He felt like if he took the medicine more frequently he would have side  effects. Initially blood pressure was elevated 144/88, however recheck was 120/80. He reports being physically active and again is asymptomatic. His EKG shows a stable left bundle branch block.  06/24/2018  Austin Moore returns today for follow-up.  Overall he feels like he is doing well.  He says he stopped his cholesterol medications, in fact all of his medications several months ago.  He related this to "just being obstinate".  Also was interested in seeing what his lipid numbers were without treatment.  He denies any chest pain or worsening shortness of breath.  He remains physically active and is asymptomatic with this.  EKG shows stable left bundle branch block.  PMHx:  Past Medical History:  Diagnosis Date  . Family history of heart disease   . History of nuclear stress test 02/17/2009   dipyridamole; negative for ischemia, EF 68% - r/t LBBB  . Hyperlipidemia    intolerant to statins - elevated LFTs  . LBBB (left bundle branch block)     Past Surgical History:  Procedure Laterality Date  . biospy  age 72 - 1968   neck  . CARDIAC CATHETERIZATION  10/1994   after false positive Thallium test - normal L main/LAD; L Cfx is large dominant vessel, RCA non-dominant (Dr. Langston ReusingA. Little)    FAMHx:  Family History  Problem Relation Age of Onset  . Cancer Mother   . Heart disease Mother   . Heart attack Father [phone]  . Heart disease Father   . Cancer Sister     SOCHx:   reports that he quit smoking about 48 years ago. His smoking use included cigarettes. He quit after 6.00 years of use. He has quit using smokeless tobacco. He reports that he drinks about 5.0 standard drinks of alcohol per week. He reports that he does not use drugs.  ALLERGIES:  Allergies  Allergen Reactions  . Peanut-Containing Drug Products Shortness Of Breath    Feels mild shortness of breath  . Lipitor [Atorvastatin]     Elevated liver enzymes  . Vytorin [Ezetimibe-Simvastatin]     Elevated liver enzymes  . Zocor  [Simvastatin]     Elevated liver enzymes    ROS: Pertinent items noted in HPI and remainder of comprehensive ROS otherwise negative.  HOME MEDS: No current outpatient medications on file.   No current facility-administered medications for this visit.     LABS/IMAGING: No results found for this or any previous visit (from the past 48 hour(s)). No results found.  VITALS: BP (!) 148/82 (BP Location: Left Arm, Patient Position: Sitting, Cuff Size: Normal)   Pulse 68   Ht 5' 10.5" (1.791 m)   Wt 184 lb (83.5 kg)   BMI 26.03 kg/m   EXAM: General appearance: alert and no distress Neck: no carotid bruit, no JVD and thyroid not enlarged, symmetric, no tenderness/mass/nodules Lungs: clear to auscultation bilaterally Heart: regular rate and rhythm, S1, S2 normal, no murmur, click, rub or gallop Abdomen: soft,  non-tender; bowel sounds normal; no masses,  no organomegaly Extremities: extremities normal, atraumatic, no cyanosis or edema Pulses: 2+ and symmetric Skin: Skin color, texture, turgor normal. No rashes or lesions Neurologic: Grossly normal Psych: Pleasant  EKG: Normal sinus rhythm 68, LBBB-personally reviewed  ASSESSMENT: 1. Stable LBBB 2. Hyperlipidemia - significantly improved 3. Strong family history of premature coronary disease 4. Tolerating Lipitor and Zetia, despite a prior history of statin intolerance due to myalgias and elevated LFTs (Lipitor, Zocor, Vytorin, Crestor)  PLAN: 1.   Austin Moore has a stable left bundle branch block without any detectable fixed splitting on his physical exam or signs or symptoms of congestive heart failure.  He denies any chest pain and is physically active.  Unfortunately stopped all of his medications.  He is due for repeat lipid profile will be interesting to compare that to his on treatment lipid profile from a year ago.  He said he would be willing to restart medications if his numbers are above his goal.  Plan follow-up with me  annually or sooner as necessary.  Chrystie NoseKenneth C. Hilty, MD, [facility] Of New EnglandFACC, FACP  [place]  [facility]  Medical Director of the [facility]  [facility] Diplomate of the American Board of Clinical Lipidology Attending Cardiologist  Direct Dial: [phone]  Fax: [phone]  Website:  [email or web address]  Kenneth C Hilty 06/24/2018, 9:21 AM

## 2018-06-27 LAB — LIPID PANEL
Chol/HDL Ratio: 6.8 ratio — ABNORMAL HIGH (ref 0.0–5.0)
Cholesterol, Total: 291 mg/dL — ABNORMAL HIGH (ref 100–199)
HDL: 43 mg/dL (ref >39)
LDL Calculated: 227 mg/dL — ABNORMAL HIGH (ref 0–99)
Triglycerides: 103 mg/dL (ref 0–149)
VLDL Cholesterol Cal: 21 mg/dL (ref 5–40)

## 2018-07-01 ENCOUNTER — Other Ambulatory Visit: Payer: Self-pay

## 2018-07-01 DIAGNOSIS — E785 Hyperlipidemia, unspecified: Secondary | ICD-10-CM

## 2018-07-01 NOTE — Progress Notes (Signed)
lip

## 2018-10-05 ENCOUNTER — Other Ambulatory Visit: Payer: Self-pay | Admitting: Internal Medicine

## 2018-10-10 NOTE — Telephone Encounter (Signed)
Rx(s) sent to pharmacy electronically.  

## 2018-10-10 NOTE — Telephone Encounter (Signed)
Agree increase to 40 mg - needs to take meds.  Dr. HRexene Edison

## 2018-10-13 ENCOUNTER — Telehealth: Payer: Self-pay | Admitting: Internal Medicine

## 2018-10-13 MED ORDER — EZETIMIBE 10 MG PO TABS: 10.0000 mg | ORAL_TABLET | Freq: Every day | ORAL | 3 refills | Status: DC

## 2018-10-13 MED ORDER — ATORVASTATIN CALCIUM 20 MG PO TABS: 20.0000 mg | ORAL_TABLET | Freq: Every day | ORAL | Status: DC

## 2018-10-13 NOTE — Telephone Encounter (Signed)
Spoke with pt, he reports he is not going to take the 40 mg of atorvastatin. He has muscle aches from statins and will not increase the dose. Okay given for the patient to cut the 40 mg tablets in 1/2. new script for zetia sent to the pharmacy.

## 2018-10-13 NOTE — Telephone Encounter (Signed)
  Pt c/o medication issue:  1. Name of Medication:atorvastatin (LIPITOR) 40 MG tablet  2. How are you currently taking this medication (dosage and times per day)? Take 1 tablet (40 mg total) by mouth daily.  3. Are you having a reaction (difficulty breathing--STAT)? No  4. What is your medication issue? Patient was taking 20 mg and when the new script was called in it was for 40 mg. Patient would like to know if he is supposed to be taking the 40 mg or does he need to split the pill in half?  Pt c/o medication issue:  1. Name of Medication: Ezetimibe 10 mg  2. How are you currently taking this medication (dosage and times per day)?   3. Are you having a reaction (difficulty breathing--STAT)? No  4. What is your medication issue? Patient went to get this filled and was told by pharmacist that they needed a new script from Dr Rennis GoldenHilty for this. I do not see this on the med list but patient [facility] but it has been a while.

## 2019-01-13 LAB — LIPID PANEL
CHOLESTEROL TOTAL: 136 mg/dL (ref 100–199)
Chol/HDL Ratio: 3.6 ratio (ref 0.0–5.0)
HDL: 38 mg/dL — ABNORMAL LOW (ref >39)
LDL Calculated: 85 mg/dL (ref 0–99)
Triglycerides: 65 mg/dL (ref 0–149)
VLDL Cholesterol Cal: 13 mg/dL (ref 5–40)

## 2019-01-30 ENCOUNTER — Telehealth: Payer: Self-pay

## 2019-01-30 NOTE — Telephone Encounter (Signed)
Virtual Visit Pre-Appointment Phone Call  TELEPHONE CALL NOTE  Austin Moore has been deemed a candidate for a follow-up tele-health visit to limit community exposure during the Covid-19 pandemic. I spoke with the patient via phone to ensure availability of phone/video source, confirm preferred email & phone number, and discuss instructions and expectations.  I reminded Austin Moore to be prepared with any vital sign and/or heart rhythm information that could potentially be obtained via home monitoring, at the time of his visit. I reminded Austin Moore to expect a phone call at the time of his visit if his visit.  Did the patient verbally acknowledge consent to treatment? Patient gave consent.  [name], CMA 01/30/2019 3:22 PM   DOWNLOADING THE WEBEX SOFTWARE TO SMARTPHONE  - If Apple, go to [employer] and type in WebEx in the search bar. Download [employer], the blue/green circle. The app is free but as with any other app downloads, their phone may require them to verify saved payment information or Apple password. The patient does NOT have to create an account.  - If Android, ask patient to go to [employer] and type in WebEx in the search bar. Download [employer], the blue/green circle. The app is free but as with any other app downloads, their phone may require them to verify saved payment information or Android password. The patient does NOT have to create an account.   CONSENT FOR TELE-HEALTH VISIT - PLEASE REVIEW  I hereby voluntarily request, consent and authorize [facility] and its employed or contracted physicians, physician assistants, nurse practitioners or other licensed health care professionals (the Practitioner), to provide me with telemedicine health care services (the "Services") as deemed necessary by the treating Practitioner. I acknowledge and consent to receive the Services by the Practitioner via telemedicine. I  understand that the telemedicine visit will involve communicating with the Practitioner through live audiovisual communication technology and the disclosure of certain medical information by electronic transmission. I acknowledge that I have been given the opportunity to request an in-person assessment or other available alternative prior to the telemedicine visit and am voluntarily participating in the telemedicine visit.  I understand that I have the right to withhold or withdraw my consent to the use of telemedicine in the course of my care at any time, without affecting my right to future care or treatment, and that the Practitioner or I may terminate the telemedicine visit at any time. I understand that I have the right to inspect all information obtained and/or recorded in the course of the telemedicine visit and may receive copies of available information for a reasonable fee.  I understand that some of the potential risks of receiving the Services via telemedicine include:  Marland Kitchen Delay or interruption in medical evaluation due to technological equipment failure or disruption; . Information transmitted may not be sufficient (e.g. poor resolution of images) to allow for appropriate medical decision making by the Practitioner; and/or  . In rare instances, security protocols could fail, causing a breach of personal health information.  Furthermore, I acknowledge that it is my responsibility to provide information about my medical history, conditions and care that is complete and accurate to the best of my ability. I acknowledge that Practitioner's advice, recommendations, and/or decision may be based on factors not within their control, such as incomplete or inaccurate data provided by me or distortions of diagnostic images or specimens that may result from electronic  transmissions. I understand that the practice of medicine is not an exact science and that Practitioner makes no warranties or guarantees  regarding treatment outcomes. I acknowledge that I will receive a copy of this consent concurrently upon execution via email to the email address I last provided but may also request a printed copy by calling the office of [facility].    I understand that my insurance will be billed for this visit.   I have read or had this consent read to me. . I understand the contents of this consent, which adequately explains the benefits and risks of the Services being provided via telemedicine.  . I have been provided ample opportunity to ask questions regarding this consent and the Services and have had my questions answered to my satisfaction. . I give my informed consent for the services to be provided through the use of telemedicine in my medical care  By participating in this telemedicine visit I agree to the above.

## 2019-01-30 NOTE — Telephone Encounter (Signed)
lmtcb

## 2019-02-02 ENCOUNTER — Other Ambulatory Visit: Payer: Self-pay

## 2019-02-03 ENCOUNTER — Encounter: Payer: Self-pay | Admitting: Internal Medicine

## 2019-02-03 ENCOUNTER — Telehealth (INDEPENDENT_AMBULATORY_CARE_PROVIDER_SITE_OTHER): Payer: Medicare Other | Admitting: Internal Medicine

## 2019-02-03 VITALS — BP 136/82 | HR 52 | Ht 70.0 in | Wt 180.4 lb

## 2019-02-03 DIAGNOSIS — I447 Left bundle-branch block, unspecified: Secondary | ICD-10-CM

## 2019-02-03 DIAGNOSIS — R002 Palpitations: Secondary | ICD-10-CM | POA: Diagnosis not present

## 2019-02-03 DIAGNOSIS — E785 Hyperlipidemia, unspecified: Secondary | ICD-10-CM | POA: Diagnosis not present

## 2019-02-03 NOTE — Patient Instructions (Signed)
Medication Instructions:  TAKE EZETIMIBE ONCE DAILY If you need a refill on your cardiac medications before your next appointment, please call your pharmacy.   Lab work: Your physician recommends that you return for lab work in: 6 MONTHS PRIOR TO EATING If you have labs (blood work) drawn today and your tests are completely normal, you will receive your results only by: Marland Kitchen MyChart Message (if you have MyChart) OR . A paper copy in the mail If you have any lab test that is abnormal or we need to change your treatment, we will call you to review the results.  Follow-Up: At [facility], you and your health needs are our priority.  As part of our continuing mission to provide you with exceptional heart care, we have created designated Provider Care Teams.  These Care Teams include your primary Cardiologist (physician) and Advanced Practice Providers (APPs -  Physician Assistants and Nurse Practitioners) who all work together to provide you with the care you need, when you need it. You will need a follow up appointment in 6 months.  Please call our office 2 months in advance to schedule this appointment.  You may see No primary care provider on file. DR HILTY or one of the following Advanced Practice Providers on your designated Care Team: Azalee Course, [place] . Micah Flesher, PA-C

## 2019-02-03 NOTE — Progress Notes (Signed)
Virtual Visit via Telephone Note    Evaluation Performed:  Routine follow-up  This visit type was conducted due to national recommendations for restrictions regarding the COVID-19 Pandemic (e.g. social distancing).  This format is felt to be most appropriate for this patient at this time.  All issues noted in this document were discussed and addressed.  No physical exam was performed (except for noted visual exam findings with Video Visits).  Please refer to the patient's chart (MyChart message for video visits and phone note for telephone visits) for the patient's consent to telehealth for [facility].  Date:  02/03/2019   ID:  Austin Moore, DOB 1946-10-25, MRN [id number]  Patient Location:  [address] [place] [address]  Provider location:   [address] [place], [place] [address]  PCP:  Austin Laughter, MD  Cardiologist:  No primary care provider on file. Electrophysiologist:  None   Chief Complaint:  No complaints  History of Present Illness:    Austin Moore is a 73 y.o. male who presents via audio/video conferencing for a telehealth visit today.  [name] is seen today in follow-up.  Overall he is feeling well.  He mentioned that back in the end of 2019, he had an episode when he went to visit his kids in [place].  He said that they had gone out to eat and he had a couple of beers.  He noted that he has been less tolerant of alcohol over the years, particularly "Hoppe" beers-which may have more alcohol content.  Nonetheless, he had an episode of palpitations.  They were persistent for about 24 hours and then eventually resolved.  He had no fatigue afterwards.  He noticed that he does get some palpitations with caffeine as well.  Overall he is feeling well and again has not had any more palpitations at all in the past 4 months.  I suspect this episode could have been a short episode of paroxysmal atrial fibrillation, again less than 24 hours.  I  advised him to continue to monitor this and if he has recurrent symptoms we will likely need to monitor him.  With regards to his dyslipidemia, prior to say that he has had a marked reduction in his cholesterol profile.  His most recent lipid profile showed total cholesterol 136, triglycerides 65, HDL 38 and LDL 85, this is improved from significantly increased number 7 months ago where total cholesterol was 291, triglycerides 103, HDL 43 and LDL of 227.  He has managed to lose about 12 pounds over the past year although has not made major dietary changes other than decreasing carbohydrates.  He reports taking atorvastatin 20 mg and Zetia 10 mg only 3 times a week, however does not have any significant myalgias with this.  The patient does not have symptoms concerning for COVID-19 infection (fever, chills, cough, or new SHORTNESS OF BREATH).    Prior CV studies:   The following studies were reviewed today:  Lipid profile from 01/13/2019  PMHx:  Past Medical History:  Diagnosis Date  . Family history of heart disease   . History of nuclear stress test 02/17/2009   dipyridamole; negative for ischemia, EF 68% - r/t LBBB  . Hyperlipidemia    intolerant to statins - elevated LFTs  . LBBB (left bundle branch block)     Past Surgical History:  Procedure Laterality Date  . biospy  age 24 - 1968   neck  . CARDIAC CATHETERIZATION  10/1994   after false positive Thallium test - normal L main/LAD; L Cfx is large dominant vessel, RCA non-dominant (Dr. Langston Moore)    FAMHx:  Family History  Problem Relation Age of Onset  . Cancer Mother   . Heart disease Mother   . Heart attack Father 76  . Heart disease Father   . Cancer Sister     SOCHx:   reports that he quit smoking about 48 years ago. His smoking use included cigarettes. He quit after 6.00 years of use. He has quit using smokeless tobacco. He reports current alcohol use of about 5.0 standard drinks of alcohol per week. He reports that he  does not use drugs.  ALLERGIES:  Allergies  Allergen Reactions  . Peanut-Containing Drug Products Shortness Of Breath    Feels mild shortness of breath  . Lipitor [Atorvastatin]     Elevated liver enzymes  . Vytorin [Ezetimibe-Simvastatin]     Elevated liver enzymes  . Zocor [Simvastatin]     Elevated liver enzymes    MEDS:  Current Meds  Medication Sig  . atorvastatin (LIPITOR) 20 MG tablet Take 1 tablet (20 mg total) by mouth daily. (Patient taking differently: Take 20 mg by mouth daily. ONE TABLET 3 X WEEKLY)  . ezetimibe (ZETIA) 10 MG tablet Take 10 mg by mouth daily.     ROS: Pertinent items noted in HPI and remainder of comprehensive ROS otherwise negative.  Labs/Other Tests and Data Reviewed:    Recent Labs: No results found for requested labs within last 8760 hours.   Recent Lipid Panel Lab Results  Component Value Date/Time   CHOL 136 01/13/2019 11:06 AM   CHOL 147 04/24/2016 08:15 AM   CHOL 148 01/27/2014 08:31 AM   TRIG 65 01/13/2019 11:06 AM   TRIG 74 04/24/2016 08:15 AM   TRIG 67 01/27/2014 08:31 AM   HDL 38 (L) 01/13/2019 11:06 AM   HDL 41 04/24/2016 08:15 AM   HDL 36 (L) 01/27/2014 08:31 AM   CHOLHDL 3.6 01/13/2019 11:06 AM   CHOLHDL 3.6 04/24/2016 08:15 AM   CHOLHDL 4.6 02/28/2015 09:22 AM   LDLCALC 85 01/13/2019 11:06 AM   LDLCALC 91 04/24/2016 08:15 AM   LDLCALC 99 01/27/2014 08:31 AM    Wt Readings from Last 3 Encounters:  02/03/19 180 lb 6.4 oz (81.8 kg)  06/24/18 184 lb (83.5 kg)  05/28/17 186 lb 12.8 oz (84.7 kg)     Exam:    Vital Signs:  BP 136/82   Pulse (!) 52   Ht 5\' 10"  (1.778 m)   Wt 180 lb 6.4 oz (81.8 kg)   BMI 25.88 kg/m    Deferred  ASSESSMENT & PLAN:    1.  Dyslipidemia 2.  Palpitations 3.  LBBB 4.  Statin intolerance  Austin Moore is tolerating low-dose atorvastatin 20 mg 3 times weekly in addition to ezetimibe 10 mg 3 times weekly.  He has had a marked reduction in his lipid profile, more likely explained by  weight loss and dietary changes in addition to medications which she completely stopped taking a year ago.  At this point, I believe he would tolerate ezetimibe on a daily basis and would likely derive even more LDL lowering from that.  I advised him to increase his ezetimibe to once daily and continue the atorvastatin 20 mg 3 times weekly which is his max tolerated dose at this point.  Ideally, would be great to drive his LDL less than 70.  Of  note, he had an episode of palpitations lasting less than 24 hours.  He had 2 beers the night prior and noticed that he has been having some intolerance to this.  Is possible that these palpitations were related to the alcohol or may have been an episode of atrial fibrillation.  I asked him to continue to monitor this.  Because the episode was so brief, I do not feel that monitoring at this point is necessary unless he becomes symptomatic again.  There is no clear indication for additional anticoagulation.  He should try to avoid stimulants such as caffeine and alcohol as much as possible.  We will plan a repeat lipid profile in 6 months and follow-up with me then.  COVID-19 Education: The signs and symptoms of COVID-19 were discussed with the patient and how to seek care for testing (follow up with PCP or arrange E-visit).  The importance of social distancing was discussed today.  Patient Risk:   After full review of this patients clinical status, I feel that they are at least moderate risk at this time.  Time:   Today, I have spent 24 minutes with the patient with telehealth technology discussing lipid management, palpitations, weight loss and dietary changes, general COVID-19 preventative information.     Medication Adjustments/Labs and Tests Ordered: Current medicines are reviewed at length with the patient today.  Concerns regarding medicines are outlined above.   Tests Ordered: Orders Placed This Encounter  Procedures  . Lipid panel    Medication  Changes: No orders of the defined types were placed in this encounter.   Disposition:  in 6 month(s)  Chrystie Nose, MD, [facility], FACP  [place]  [facility]  Medical Director of the Advanced Lipid Disorders &  Cardiovascular Risk Reduction Clinic Diplomate of the American Board of Clinical Lipidology Attending Cardiologist  Direct Dial: [phone]  Fax: [phone]  Website:  [email or web address]  Chrystie Nose, MD  02/03/2019 8:33 AM

## 2019-07-10 ENCOUNTER — Other Ambulatory Visit: Payer: Self-pay | Admitting: Internal Medicine

## 2019-11-14 ENCOUNTER — Other Ambulatory Visit: Payer: Self-pay | Admitting: Internal Medicine

## 2019-12-01 ENCOUNTER — Ambulatory Visit: Payer: Medicare Other

## 2019-12-04 ENCOUNTER — Ambulatory Visit: Payer: Medicare Other

## 2019-12-10 ENCOUNTER — Ambulatory Visit: Payer: Medicare PPO | Attending: Internal Medicine

## 2019-12-10 DIAGNOSIS — Z23 Encounter for immunization: Secondary | ICD-10-CM | POA: Insufficient documentation

## 2019-12-10 NOTE — Progress Notes (Signed)
   Covid-19 Vaccination Clinic  Name:  Austin Moore    MRN: [id number] DOB: May 02, 1946  12/10/2019  [name] was observed post Covid-19 immunization for 15 minutes without incidence. He was provided with Vaccine Information Sheet and instruction to access the V-Safe system.   [name] was instructed to call 911 with any severe reactions post vaccine: Marland Kitchen Difficulty breathing  . Swelling of your face and throat  . A fast heartbeat  . A bad rash all over your body  . Dizziness and weakness    Immunizations Administered    Name Date Dose VIS Date Route   Pfizer COVID-19 Vaccine 12/10/2019  9:03 AM 0.3 mL 10/16/2019 Intramuscular   Manufacturer: [employer], [employer]   Lot: [id number]   NDC: [address]-1

## 2019-12-15 ENCOUNTER — Ambulatory Visit: Payer: Medicare Other

## 2020-01-04 ENCOUNTER — Ambulatory Visit: Payer: Medicare PPO | Attending: Internal Medicine

## 2020-01-04 DIAGNOSIS — Z23 Encounter for immunization: Secondary | ICD-10-CM

## 2020-01-04 NOTE — Progress Notes (Signed)
   Covid-19 Vaccination Clinic  Name:  Austin Moore    MRN: [id number] DOB: Jul 09, 1946  01/04/2020  Austin Moore was observed post Covid-19 immunization for 15 minutes without incidence. He was provided with Vaccine Information Sheet and instruction to access the V-Safe system.   Austin Moore was instructed to call 911 with any severe reactions post vaccine: Marland Kitchen Difficulty breathing  . Swelling of your face and throat  . A fast heartbeat  . A bad rash all over your body  . Dizziness and weakness    Immunizations Administered    Name Date Dose VIS Date Route   Pfizer COVID-19 Vaccine 01/04/2020  1:28 PM 0.3 mL 10/16/2019 Intramuscular   Manufacturer: [employer], [employer]   Lot: [id number]   NDC: [address]-8

## 2020-03-12 ENCOUNTER — Other Ambulatory Visit: Payer: Self-pay | Admitting: Internal Medicine

## 2020-06-13 ENCOUNTER — Other Ambulatory Visit: Payer: Self-pay | Admitting: Internal Medicine

## 2020-06-13 MED ORDER — EZETIMIBE 10 MG PO TABS: 10.0000 mg | ORAL_TABLET | Freq: Every day | ORAL | 1 refills | Status: DC

## 2020-06-13 NOTE — Telephone Encounter (Signed)
Returned call to pt.  He has been made aware that we will send in 90 day refill for Zetia, pt has an appt 08/17/20.  Pt will come and have his overdue lipid profile done before he sees Dr. Rennis Golden.  Pt was very appreciative for the call back.

## 2020-06-13 NOTE — Telephone Encounter (Signed)
*  STAT* If patient is at the pharmacy, call can be transferred to refill team.   1. Which medications need to be refilled? (please list name of each medication and dose if known)  ezetimibe (ZETIA) 10 MG tablet  2. Which pharmacy/location (including street and city if local pharmacy) is medication to be sent to? [facility] [address] [place], [place], [place] [address]  3. Do they need a 30 day or 90 day supply? 90 day supply

## 2020-06-15 ENCOUNTER — Other Ambulatory Visit: Payer: Self-pay | Admitting: Internal Medicine

## 2020-06-15 MED ORDER — EZETIMIBE 10 MG PO TABS: 10.0000 mg | ORAL_TABLET | Freq: Every day | ORAL | 0 refills | Status: DC

## 2020-06-15 NOTE — Telephone Encounter (Signed)
Pt c/o medication issue:  1. Name of Medication: ezetimibe (ZETIA) 10 MG tablet  2. How are you currently taking this medication (dosage and times per day)? As directed  3. Are you having a reaction (difficulty breathing--STAT)? No  4. What is your medication issue? Patient states he is requesting to have Rx sent to [facility] - [address] [place], [place], [place] [address]. He states he had a bad experience with [id number] - [place],  - [address]) and he would like them removed from his list.

## 2020-06-15 NOTE — Telephone Encounter (Signed)
Rx sent to [facility]

## 2020-07-05 LAB — LIPID PANEL
Chol/HDL Ratio: 3.6 ratio (ref 0.0–5.0)
Cholesterol, Total: 144 mg/dL (ref 100–199)
HDL: 40 mg/dL (ref >39)
LDL Chol Calc (NIH): 91 mg/dL (ref 0–99)
Triglycerides: 65 mg/dL (ref 0–149)
VLDL Cholesterol Cal: 13 mg/dL (ref 5–40)

## 2020-07-18 ENCOUNTER — Encounter: Payer: Self-pay | Admitting: Internal Medicine

## 2020-08-05 ENCOUNTER — Encounter: Payer: Self-pay | Admitting: Internal Medicine

## 2020-08-05 ENCOUNTER — Ambulatory Visit (INDEPENDENT_AMBULATORY_CARE_PROVIDER_SITE_OTHER): Payer: Medicare PPO | Admitting: Internal Medicine

## 2020-08-05 ENCOUNTER — Other Ambulatory Visit: Payer: Self-pay

## 2020-08-05 VITALS — BP 122/80 | HR 71 | Ht 70.0 in | Wt 190.0 lb

## 2020-08-05 DIAGNOSIS — E785 Hyperlipidemia, unspecified: Secondary | ICD-10-CM

## 2020-08-05 DIAGNOSIS — R002 Palpitations: Secondary | ICD-10-CM

## 2020-08-05 DIAGNOSIS — I447 Left bundle-branch block, unspecified: Secondary | ICD-10-CM

## 2020-08-05 NOTE — Progress Notes (Signed)
OFFICE NOTE  Chief Complaint:  No complaints  Primary Care Physician: Merlene Laughter, MD  HPI:  Austin Moore is a pleasant 74 yo male with a strong family history of heart disease with his father having his 1st cardiac event at age 77. In addition to that, he has hyperlipidemia and has been intolerant to statins in the past because of elevated liver functions. He had been able to take Lipitor 10 mg tablets 3 times a week with no change in his liver functions and no myalgias. His blood pressure which is usually under good control away from this office still remains borderline high. He is completely asymptomatic. He has had no chest pain, no unusual shortness of breath, and no evidence of any arrhythmias. He developed a left bundle branch block in 2010 and as a result of that had a stress test performed in April 2010 that was negative for ischemia and showed a 68% ejection fraction. In 2011 the left bundle branch block had resolved and today he again has his left bundle branch block. It seemed to be intermittent but clearly does not appear to be ischemic mediated based on his stress test 2 years ago.   At his last visit, I recommended adding low-dose Lipitor and Zetia back to his regimen. This is do to an elevated lipid profile with him an MR that demonstrated an LDL particle number of 2700. His LDL C. at that time was 213. Over the past 3 months he's increased his exercise as noted a marked improvement in his energy level. He says he's been tolerating his medications and has had no problems with muscle pain or soreness. His recent lipid profile demonstrated total cholesterol 135, triglycerides 77, HDL 39 and LDL 81, a marked improvement in his lipid profile. The remainder of his labs including a CK was normal at 95 an AST and ALT were both within normal limits. Does not appear that he is having the liver enzyme abnormalities she had in the past. He is very pleased with his current medicine  regimen.  Austin Moore returns today for followup of his lipid profile.  There's been a slight increase in cholesterol since his last cholesterol test however LDL particle number is improved. Currently his LDL-P is 1483, down from 2792.  LDL C. is 99, down from 213. Total cholesterol is 148. This demonstrated marked improvement over his previous blood work which is in part due to his exercise, diet and the addition of Zetia. I've been hesitant to increase the dose of Lipitor due to his abnormal liver function and problems with statins in the past. He seems to be tolerating the medication combination currently without side effects. Comprehensive metabolic profile shows normal AST and ALT as well as renal function.  I saw Austin Moore acted in the office. Overall he is doing well denies any chest discomfort or worsening shortness of breath. He's had a pretty good control of his lipid profile but takes Lipitor only 3-4 times a week as well as Zetia. EKG shows a stable left bundle branch block.  05/01/2016  Austin Moore was seen in the office today in follow-up. Overall he seems to be doing quite well. He continues to be active and denies any chest pain or worsening shortness of breath. He reports she's not been as diligent with his medications for cholesterol however a repeat cholesterol profile was drawn today and shows an improvement with an LDL-P of 1255, total cholesterol 147, HDL-C 41, triglycerides  74 and LDL-C 91. He seems to have a stable left bundle branch block. He also reported some intermittent numbness and tingling around the left ear recently.  05/28/2017  Austin Moore was seen today in follow-up. He says he feels fantastic urine he denies any chest pain or worsening shortness of breath. He's recently been decreasing his atorvastatin and ezetimibe for unknown reasons. He says he takes it probably 2 or 3 times a week together. He felt like if he took the medicine more frequently he would have side  effects. Initially blood pressure was elevated 144/88, however recheck was 120/80. He reports being physically active and again is asymptomatic. His EKG shows a stable left bundle branch block.  06/24/2018  Austin Moore returns today for follow-up.  Overall he feels like he is doing well.  He says he stopped his cholesterol medications, in fact all of his medications several months ago.  He related this to "just being obstinate".  Also was interested in seeing what his lipid numbers were without treatment.  He denies any chest pain or worsening shortness of breath.  He remains physically active and is asymptomatic with this.  EKG shows stable left bundle branch block.  08/05/2020  Austin Moore is seen today in follow-up.  I saw him last year via telemedicine visit.  Overall he is been doing well.  He denies any chest pain or worsening shortness of breath.  His total cholesterol 2 years ago was 291, now down to 144 with triglycerides 65, HDL 40 and LDL of 91 on 20 mg atorvastatin and 10 mg of ezetimibe.  He has a stable left bundle branch block.  Denies any heart failure symptoms.  He denies any chest pain with exertion.  He reports very infrequent palpitations.  I wondered if this could be A. fib but has not had any significant event in the last year.  If this recurs, I suggested he might want to consider the cardia mobile device.  PMHx:  Past Medical History:  Diagnosis Date  . Family history of heart disease   . History of nuclear stress test 02/17/2009   dipyridamole; negative for ischemia, EF 68% - r/t LBBB  . Hyperlipidemia    intolerant to statins - elevated LFTs  . LBBB (left bundle branch block)     Past Surgical History:  Procedure Laterality Date  . biospy  age 45 - 1968   neck  . CARDIAC CATHETERIZATION  10/1994   after false positive Thallium test - normal L main/LAD; L Cfx is large dominant vessel, RCA non-dominant (Dr. Langston Reusing)    FAMHx:  Family History  Problem Relation Age  of Onset  . Cancer Mother   . Heart disease Mother   . Heart attack Father 20  . Heart disease Father   . Cancer Sister     SOCHx:   reports that he quit smoking about 50 years ago. His smoking use included cigarettes. He quit after 6.00 years of use. He has quit using smokeless tobacco. He reports current alcohol use of about 5.0 standard drinks of alcohol per week. He reports that he does not use drugs.  ALLERGIES:  Allergies  Allergen Reactions  . Peanut-Containing Drug Products Shortness Of Breath    Feels mild shortness of breath  . Lipitor [Atorvastatin]     Elevated liver enzymes  . Vytorin [Ezetimibe-Simvastatin]     Elevated liver enzymes  . Zocor [Simvastatin]     Elevated liver enzymes    ROS:  Pertinent items noted in HPI and remainder of comprehensive ROS otherwise negative.  HOME MEDS: Current Outpatient Medications  Medication Sig Dispense Refill  . atorvastatin (LIPITOR) 20 MG tablet Take 1 tablet (20 mg total) by mouth daily. (Patient taking differently: Take 20 mg by mouth daily. ONE TABLET 3 X WEEKLY)    . ezetimibe (ZETIA) 10 MG tablet Take 1 tablet (10 mg total) by mouth daily. 90 tablet 0   No current facility-administered medications for this visit.    LABS/IMAGING: No results found for this or any previous visit (from the past 48 hour(s)). No results found.  VITALS: BP 122/80   Pulse 71   Ht 5\' 10"  (1.778 m)   Wt 190 lb (86.2 kg)   SpO2 95%   BMI 27.26 kg/m   EXAM: General appearance: alert and no distress Neck: no carotid bruit, no JVD and thyroid not enlarged, symmetric, no tenderness/mass/nodules Lungs: clear to auscultation bilaterally Heart: regular rate and rhythm, S1, S2 normal, no murmur, click, rub or gallop Abdomen: soft, non-tender; bowel sounds normal; no masses,  no organomegaly Extremities: extremities normal, atraumatic, no cyanosis or edema Pulses: 2+ and symmetric Skin: Skin color, texture, turgor normal. No rashes or  lesions Neurologic: Grossly normal Psych: Pleasant  EKG: Normal sinus rhythm 71, LBBB-personally reviewed  ASSESSMENT: 1. Stable LBBB 2. Hyperlipidemia - significantly improved 3. Strong family history of premature coronary disease 4. Tolerating Lipitor and Zetia, despite a prior history of statin intolerance due to myalgias and elevated LFTs (Lipitor, Zocor, Vytorin, Crestor) 5. Infrequent palpitations  PLAN: 1.   Austin Moore continues to do well and is stable with regards to his left bundle branch block.  Denies any chest pain or worsening shortness of breath.  He has infrequent palpitations and may want to consider the cardia mobile device.  His lipids have improved markedly.  He exercises regularly and is asymptomatic with this.  No changes in medicines today.  Follow-up with me annually or sooner as necessary.  Dorris Fetch, MD, [facility], FACP    [facility]  Medical Director of the Advanced Lipid Disorders &  Cardiovascular Risk Reduction Clinic Diplomate of the American Board of Clinical Lipidology Attending Cardiologist  Direct Dial: [phone]  Fax: [phone]  Website:  [email or web address] Cloria Ciresi 08/05/2020, 2:59 PM

## 2020-08-05 NOTE — Patient Instructions (Signed)

## 2020-08-17 ENCOUNTER — Ambulatory Visit: Payer: Medicare PPO | Admitting: Internal Medicine

## 2020-09-09 ENCOUNTER — Other Ambulatory Visit: Payer: Self-pay | Admitting: Internal Medicine

## 2020-11-14 DIAGNOSIS — R972 Elevated prostate specific antigen [PSA]: Secondary | ICD-10-CM | POA: Diagnosis not present

## 2020-11-16 DIAGNOSIS — N400 Enlarged prostate without lower urinary tract symptoms: Secondary | ICD-10-CM | POA: Diagnosis not present

## 2020-11-16 DIAGNOSIS — R972 Elevated prostate specific antigen [PSA]: Secondary | ICD-10-CM | POA: Diagnosis not present

## 2020-11-25 ENCOUNTER — Other Ambulatory Visit: Payer: Self-pay | Admitting: Urology

## 2020-11-25 DIAGNOSIS — R972 Elevated prostate specific antigen [PSA]: Secondary | ICD-10-CM

## 2020-12-14 ENCOUNTER — Ambulatory Visit
Admission: RE | Admit: 2020-12-14 | Discharge: 2020-12-14 | Disposition: A | Discharge status: Home / Self Care | Payer: Medicare PPO | Source: Ambulatory Visit | Attending: Urology | Admitting: Urology

## 2020-12-14 ENCOUNTER — Other Ambulatory Visit: Payer: Self-pay

## 2020-12-14 DIAGNOSIS — N402 Nodular prostate without lower urinary tract symptoms: Secondary | ICD-10-CM | POA: Diagnosis not present

## 2020-12-14 DIAGNOSIS — R972 Elevated prostate specific antigen [PSA]: Secondary | ICD-10-CM

## 2020-12-14 MED ORDER — GADOBENATE DIMEGLUMINE 529 MG/ML IV SOLN
17.0000 mL | Freq: Once | INTRAVENOUS | Status: AC | PRN
  Administered 2020-12-14: 17 mL via INTRAVENOUS

## 2020-12-19 DIAGNOSIS — R972 Elevated prostate specific antigen [PSA]: Secondary | ICD-10-CM | POA: Diagnosis not present

## 2020-12-19 DIAGNOSIS — N400 Enlarged prostate without lower urinary tract symptoms: Secondary | ICD-10-CM | POA: Diagnosis not present

## 2020-12-26 DIAGNOSIS — R972 Elevated prostate specific antigen [PSA]: Secondary | ICD-10-CM | POA: Diagnosis not present

## 2021-01-02 DIAGNOSIS — R972 Elevated prostate specific antigen [PSA]: Secondary | ICD-10-CM | POA: Diagnosis not present

## 2021-01-02 DIAGNOSIS — N400 Enlarged prostate without lower urinary tract symptoms: Secondary | ICD-10-CM | POA: Diagnosis not present

## 2021-02-01 ENCOUNTER — Telehealth: Payer: Self-pay | Admitting: Internal Medicine

## 2021-02-01 NOTE — Telephone Encounter (Signed)
[facility] will be sending over a fax because because is transferring [facility] and [facility]. Please be on lookout for fax

## 2021-02-02 ENCOUNTER — Telehealth: Payer: Self-pay | Admitting: Internal Medicine

## 2021-02-02 MED ORDER — ATORVASTATIN CALCIUM 20 MG PO TABS: 20.0000 mg | ORAL_TABLET | Freq: Every day | ORAL | 2 refills | Status: DC

## 2021-02-02 NOTE — Telephone Encounter (Signed)
*  STAT* If patient is at the pharmacy, call can be transferred to refill team.   1. Which medications need to be refilled? (please list name of each medication and dose if known) atorvastatin (LIPITOR) 20 MG tablet  2. Which pharmacy/location (including street and city if local pharmacy) is medication to be sent to? [id number] - [place], [place] - [address]  3. Do they need a 30 day or 90 day supply?  90 day supply  [facility] has been attempting to fax a request for this medication but has been unsuccessful at receiving a response. Pt is almost out of medication

## 2021-02-07 MED ORDER — ATORVASTATIN CALCIUM 20 MG PO TABS: 20.0000 mg | ORAL_TABLET | Freq: Every day | ORAL | 3 refills | Status: DC

## 2021-02-07 NOTE — Telephone Encounter (Signed)
Called pt to confirm dose of atorvastatin. Pt state he has been taking 20 mg daily as ordered. New Rx sent to pharmacy.

## 2021-02-07 NOTE — Telephone Encounter (Signed)
Patient following up. He is now out of medication. Can you all help with this?

## 2021-02-07 NOTE — Addendum Note (Signed)
Addended by: Parke Poisson on: 02/07/2021 01:41 PM   Modules accepted: Orders

## 2021-02-08 DIAGNOSIS — H2513 Age-related nuclear cataract, bilateral: Secondary | ICD-10-CM | POA: Diagnosis not present

## 2021-02-08 DIAGNOSIS — H52203 Unspecified astigmatism, bilateral: Secondary | ICD-10-CM | POA: Diagnosis not present

## 2021-02-09 ENCOUNTER — Encounter: Payer: Self-pay | Admitting: Internal Medicine

## 2021-02-09 MED ORDER — ATORVASTATIN CALCIUM 20 MG PO TABS: 20.0000 mg | ORAL_TABLET | Freq: Every day | ORAL | 3 refills | Status: DC

## 2021-02-09 NOTE — Telephone Encounter (Signed)
Pt following up

## 2021-02-09 NOTE — Telephone Encounter (Signed)
Spoke to patient. Informed patient prescription was taken care of 02/07/21. Patient states he would like to come to the office and pick a hand written prescription and take to [facility]  prescription will besigned by Dr Rennis Golden and placed for patient to pick up Patient verbalized understanding.

## 2021-02-09 NOTE — Telephone Encounter (Signed)
*  STAT* If patient is at the pharmacy, call can be transferred to refill team.   1. Which medications need to be refilled? (please list name of each medication and dose if known)  atorvastatin (LIPITOR) 20 MG tablet  2. Which pharmacy/location (including street and city if local pharmacy) is medication to be sent to? [id number] - [place], [place] - [address]  3. Do they need a 30 day or 90 day supply?  90 day supply  Patient is following up.  He states the pharmacy still has not received the atorvastatin Rx. He would like to know if someone can hand-write the the prescription. He states he can come by the office and pick it up and then drop it off at the pharmacy.

## 2021-02-09 NOTE — Telephone Encounter (Signed)
error 

## 2021-02-09 NOTE — Addendum Note (Signed)
Addended by: Tobin Chad on: 02/09/2021 03:04 PM   Modules accepted: Orders

## 2021-02-09 NOTE — Addendum Note (Signed)
Addended by: Bea Laura B on: 02/09/2021 02:53 PM   Modules accepted: Orders

## 2021-05-15 DIAGNOSIS — U071 COVID-19: Secondary | ICD-10-CM | POA: Diagnosis not present

## 2021-06-22 DIAGNOSIS — R972 Elevated prostate specific antigen [PSA]: Secondary | ICD-10-CM | POA: Diagnosis not present

## 2021-06-29 DIAGNOSIS — R972 Elevated prostate specific antigen [PSA]: Secondary | ICD-10-CM | POA: Diagnosis not present

## 2021-06-29 DIAGNOSIS — N400 Enlarged prostate without lower urinary tract symptoms: Secondary | ICD-10-CM | POA: Diagnosis not present

## 2021-06-29 DIAGNOSIS — R3915 Urgency of urination: Secondary | ICD-10-CM | POA: Diagnosis not present

## 2021-08-09 DIAGNOSIS — Z79899 Other long term (current) drug therapy: Secondary | ICD-10-CM | POA: Diagnosis not present

## 2021-08-09 DIAGNOSIS — E7800 Pure hypercholesterolemia, unspecified: Secondary | ICD-10-CM | POA: Diagnosis not present

## 2021-08-16 DIAGNOSIS — Z1389 Encounter for screening for other disorder: Secondary | ICD-10-CM | POA: Diagnosis not present

## 2021-08-16 DIAGNOSIS — Z0000 Encounter for general adult medical examination without abnormal findings: Secondary | ICD-10-CM | POA: Diagnosis not present

## 2021-08-16 DIAGNOSIS — Z79899 Other long term (current) drug therapy: Secondary | ICD-10-CM | POA: Diagnosis not present

## 2021-08-16 DIAGNOSIS — E7800 Pure hypercholesterolemia, unspecified: Secondary | ICD-10-CM | POA: Diagnosis not present

## 2021-08-16 DIAGNOSIS — Z23 Encounter for immunization: Secondary | ICD-10-CM | POA: Diagnosis not present

## 2021-08-21 DIAGNOSIS — M25551 Pain in right hip: Secondary | ICD-10-CM | POA: Diagnosis not present

## 2021-08-21 DIAGNOSIS — M545 Low back pain, unspecified: Secondary | ICD-10-CM | POA: Diagnosis not present

## 2021-08-31 ENCOUNTER — Other Ambulatory Visit: Payer: Self-pay | Admitting: Orthopedic Surgery

## 2021-08-31 DIAGNOSIS — M5416 Radiculopathy, lumbar region: Secondary | ICD-10-CM

## 2021-08-31 DIAGNOSIS — S72001A Fracture of unspecified part of neck of right femur, initial encounter for closed fracture: Secondary | ICD-10-CM

## 2021-08-31 DIAGNOSIS — M25551 Pain in right hip: Secondary | ICD-10-CM | POA: Diagnosis not present

## 2021-09-13 ENCOUNTER — Ambulatory Visit
Admission: RE | Admit: 2021-09-13 | Discharge: 2021-09-13 | Disposition: A | Discharge status: Home / Self Care | Payer: Medicare PPO | Source: Ambulatory Visit | Attending: Orthopedic Surgery | Admitting: Orthopedic Surgery

## 2021-09-13 ENCOUNTER — Other Ambulatory Visit: Payer: Self-pay

## 2021-09-13 DIAGNOSIS — M545 Low back pain, unspecified: Secondary | ICD-10-CM | POA: Diagnosis not present

## 2021-09-13 DIAGNOSIS — M1611 Unilateral primary osteoarthritis, right hip: Secondary | ICD-10-CM | POA: Diagnosis not present

## 2021-09-13 DIAGNOSIS — M25851 Other specified joint disorders, right hip: Secondary | ICD-10-CM | POA: Diagnosis not present

## 2021-09-13 DIAGNOSIS — R531 Weakness: Secondary | ICD-10-CM | POA: Diagnosis not present

## 2021-09-13 DIAGNOSIS — R29898 Other symptoms and signs involving the musculoskeletal system: Secondary | ICD-10-CM | POA: Diagnosis not present

## 2021-09-13 DIAGNOSIS — M5416 Radiculopathy, lumbar region: Secondary | ICD-10-CM

## 2021-09-13 DIAGNOSIS — M48061 Spinal stenosis, lumbar region without neurogenic claudication: Secondary | ICD-10-CM | POA: Diagnosis not present

## 2021-09-13 DIAGNOSIS — S72001A Fracture of unspecified part of neck of right femur, initial encounter for closed fracture: Secondary | ICD-10-CM

## 2021-09-13 DIAGNOSIS — M533 Sacrococcygeal disorders, not elsewhere classified: Secondary | ICD-10-CM | POA: Diagnosis not present

## 2021-09-14 ENCOUNTER — Other Ambulatory Visit: Payer: Self-pay

## 2021-09-14 ENCOUNTER — Other Ambulatory Visit: Payer: Self-pay | Admitting: *Deleted

## 2021-09-14 DIAGNOSIS — D2261 Melanocytic nevi of right upper limb, including shoulder: Secondary | ICD-10-CM | POA: Diagnosis not present

## 2021-09-14 DIAGNOSIS — Z85828 Personal history of other malignant neoplasm of skin: Secondary | ICD-10-CM | POA: Diagnosis not present

## 2021-09-14 DIAGNOSIS — D2371 Other benign neoplasm of skin of right lower limb, including hip: Secondary | ICD-10-CM | POA: Diagnosis not present

## 2021-09-14 DIAGNOSIS — E785 Hyperlipidemia, unspecified: Secondary | ICD-10-CM | POA: Diagnosis not present

## 2021-09-14 DIAGNOSIS — D485 Neoplasm of uncertain behavior of skin: Secondary | ICD-10-CM | POA: Diagnosis not present

## 2021-09-14 DIAGNOSIS — D2262 Melanocytic nevi of left upper limb, including shoulder: Secondary | ICD-10-CM | POA: Diagnosis not present

## 2021-09-14 DIAGNOSIS — D225 Melanocytic nevi of trunk: Secondary | ICD-10-CM | POA: Diagnosis not present

## 2021-09-14 DIAGNOSIS — D1801 Hemangioma of skin and subcutaneous tissue: Secondary | ICD-10-CM | POA: Diagnosis not present

## 2021-09-14 LAB — LIPID PANEL
Chol/HDL Ratio: 3 ratio (ref 0.0–5.0)
Cholesterol, Total: 158 mg/dL (ref 100–199)
HDL: 52 mg/dL (ref >39)
LDL Chol Calc (NIH): 91 mg/dL (ref 0–99)
Triglycerides: 81 mg/dL (ref 0–149)
VLDL Cholesterol Cal: 15 mg/dL (ref 5–40)

## 2021-09-14 NOTE — Progress Notes (Signed)
For upcoming office appointment

## 2021-09-15 ENCOUNTER — Encounter: Payer: Self-pay | Admitting: *Deleted

## 2021-09-18 DIAGNOSIS — M25551 Pain in right hip: Secondary | ICD-10-CM | POA: Diagnosis not present

## 2021-09-20 ENCOUNTER — Encounter: Payer: Self-pay | Admitting: Internal Medicine

## 2021-09-20 ENCOUNTER — Other Ambulatory Visit: Payer: Self-pay

## 2021-09-20 ENCOUNTER — Ambulatory Visit: Payer: Medicare PPO | Admitting: Internal Medicine

## 2021-09-20 VITALS — BP 132/78 | HR 83 | Ht 70.0 in | Wt 182.8 lb

## 2021-09-20 DIAGNOSIS — R002 Palpitations: Secondary | ICD-10-CM | POA: Diagnosis not present

## 2021-09-20 DIAGNOSIS — I447 Left bundle-branch block, unspecified: Secondary | ICD-10-CM

## 2021-09-20 DIAGNOSIS — I491 Atrial premature depolarization: Secondary | ICD-10-CM

## 2021-09-20 DIAGNOSIS — E785 Hyperlipidemia, unspecified: Secondary | ICD-10-CM | POA: Diagnosis not present

## 2021-09-20 NOTE — Patient Instructions (Signed)
Medication Instructions:  Continue same medications *If you need a refill on your cardiac medications before your next appointment, please call your pharmacy*   Lab Work: None ordered   Testing/Procedures: None ordered   Follow-Up: At [facility], you and your health needs are our priority.  As part of our continuing mission to provide you with exceptional heart care, we have created designated Provider Care Teams.  These Care Teams include your primary Cardiologist (physician) and Advanced Practice Providers (APPs -  Physician Assistants and Nurse Practitioners) who all work together to provide you with the care you need, when you need it.  We recommend signing up for the patient portal called "MyChart".  Sign up information is provided on this After Visit Summary.  MyChart is used to connect with patients for Virtual Visits (Telemedicine).  Patients are able to view lab/test results, encounter notes, upcoming appointments, etc.  Non-urgent messages can be sent to your provider as well.   To learn more about what you can do with MyChart, go to [email or web address].      Your next appointment:  1 year    The format for your next appointment: Office   Provider:  Dr.Hilty   

## 2021-09-20 NOTE — Progress Notes (Signed)
OFFICE NOTE  Chief Complaint:  Palpitations  Primary Care Physician: Merlene Laughter, MD  HPI:  Austin Moore is a pleasant 75 yo male with a strong family history of heart disease with his father having his 1st cardiac event at age 24. In addition to that, he has hyperlipidemia and has been intolerant to statins in the past because of elevated liver functions. He had been able to take Lipitor 10 mg tablets 3 times a week with no change in his liver functions and no myalgias. His blood pressure which is usually under good control away from this office still remains borderline high. He is completely asymptomatic. He has had no chest pain, no unusual shortness of breath, and no evidence of any arrhythmias. He developed a left bundle branch block in 2010 and as a result of that had a stress test performed in April 2010 that was negative for ischemia and showed a 68% ejection fraction. In 2011 the left bundle branch block had resolved and today he again has his left bundle branch block. It seemed to be intermittent but clearly does not appear to be ischemic mediated based on his stress test 2 years ago.   At his last visit, I recommended adding low-dose Lipitor and Zetia back to his regimen. This is do to an elevated lipid profile with him an MR that demonstrated an LDL particle number of 2700. His LDL C. at that time was 213. Over the past 3 months he's increased his exercise as noted a marked improvement in his energy level. He says he's been tolerating his medications and has had no problems with muscle pain or soreness. His recent lipid profile demonstrated total cholesterol 135, triglycerides 77, HDL 39 and LDL 81, a marked improvement in his lipid profile. The remainder of his labs including a CK was normal at 95 an AST and ALT were both within normal limits. Does not appear that he is having the liver enzyme abnormalities she had in the past. He is very pleased with his current medicine  regimen.  Austin Moore returns today for followup of his lipid profile.  There's been a slight increase in cholesterol since his last cholesterol test however LDL particle number is improved. Currently his LDL-P is 1483, down from 2792.  LDL C. is 99, down from 213. Total cholesterol is 148. This demonstrated marked improvement over his previous blood work which is in part due to his exercise, diet and the addition of Zetia. I've been hesitant to increase the dose of Lipitor due to his abnormal liver function and problems with statins in the past. He seems to be tolerating the medication combination currently without side effects. Comprehensive metabolic profile shows normal AST and ALT as well as renal function.  I saw Mr. Main acted in the office. Overall he is doing well denies any chest discomfort or worsening shortness of breath. He's had a pretty good control of his lipid profile but takes Lipitor only 3-4 times a week as well as Zetia. EKG shows a stable left bundle branch block.  05/01/2016  Austin Moore was seen in the office today in follow-up. Overall he seems to be doing quite well. He continues to be active and denies any chest pain or worsening shortness of breath. He reports she's not been as diligent with his medications for cholesterol however a repeat cholesterol profile was drawn today and shows an improvement with an LDL-P of 1255, total cholesterol 147, HDL-C 41, triglycerides 74  and LDL-C 91. He seems to have a stable left bundle branch block. He also reported some intermittent numbness and tingling around the left ear recently.  05/28/2017  Austin Moore was seen today in follow-up. He says he feels fantastic urine he denies any chest pain or worsening shortness of breath. He's recently been decreasing his atorvastatin and ezetimibe for unknown reasons. He says he takes it probably 2 or 3 times a week together. He felt like if he took the medicine more frequently he would have side  effects. Initially blood pressure was elevated 144/88, however recheck was 120/80. He reports being physically active and again is asymptomatic. His EKG shows a stable left bundle branch block.  06/24/2018  Austin Moore returns today for follow-up.  Overall he feels like he is doing well.  He says he stopped his cholesterol medications, in fact all of his medications several months ago.  He related this to "just being obstinate".  Also was interested in seeing what his lipid numbers were without treatment.  He denies any chest pain or worsening shortness of breath.  He remains physically active and is asymptomatic with this.  EKG shows stable left bundle branch block.  08/05/2020  Austin Moore is seen today in follow-up.  I saw him last year via telemedicine visit.  Overall he is been doing well.  He denies any chest pain or worsening shortness of breath.  His total cholesterol 2 years ago was 291, now down to 144 with triglycerides 65, HDL 40 and LDL of 91 on 20 mg atorvastatin and 10 mg of ezetimibe.  He has a stable left bundle branch block.  Denies any heart failure symptoms.  He denies any chest pain with exertion.  He reports very infrequent palpitations.  I wondered if this could be A. fib but has not had any significant event in the last year.  If this recurs, I suggested he might want to consider the cardia mobile device.  09/20/2021  Austin Moore today for follow-up.  He recently saw his primary care provider and subsequently developed a issue with hip pain.  He was treated with anti-inflammatories/steroids and muscle relaxants.  He noted that he had increase in palpitations or what he felt like were hard heartbeats however they have improved somewhat.  He is monitored this with the cardio mobile device.  It suggested that he was not in A. fib rather was having ectopy.  EKG here today shows sinus rhythm with PACs.  Blood pressures is well controlled.  His cholesterols been well treated with LDL less  than 100.  He denies any chest pain or worsening shortness of breath.  He has persistent left bundle branch block but has not had any issues with heart failure or known coronary disease.  PMHx:  Past Medical History:  Diagnosis Date   Family history of heart disease    History of nuclear stress test 02/17/2009   dipyridamole; negative for ischemia, EF 68% - r/t LBBB   Hyperlipidemia    intolerant to statins - elevated LFTs   LBBB (left bundle branch block)     Past Surgical History:  Procedure Laterality Date   biospy  age 69 - 53   neck   CARDIAC CATHETERIZATION  10/1994   after false positive Thallium test - normal L main/LAD; L Cfx is large dominant vessel, RCA non-dominant (Dr. Langston Reusing)    FAMHx:  Family History  Problem Relation Age of Onset   Cancer Mother  Heart disease Mother    Heart attack Father 7   Heart disease Father    Cancer Sister     SOCHx:   reports that he quit smoking about 51 years ago. His smoking use included cigarettes. He has quit using smokeless tobacco. He reports current alcohol use of about 5.0 standard drinks per week. He reports that he does not use drugs.  ALLERGIES:  Allergies  Allergen Reactions   Peanut-Containing Drug Products Shortness Of Breath    Feels mild shortness of breath   Lipitor [Atorvastatin]     Elevated liver enzymes   Vytorin [Ezetimibe-Simvastatin]     Elevated liver enzymes   Zocor [Simvastatin]     Elevated liver enzymes    ROS: Pertinent items noted in HPI and remainder of comprehensive ROS otherwise negative.  HOME MEDS: Current Outpatient Medications  Medication Sig Dispense Refill   atorvastatin (LIPITOR) 20 MG tablet Take 1 tablet (20 mg total) by mouth daily. 90 tablet 3   cholecalciferol (VITAMIN D3) 25 MCG (1000 UNIT) tablet 1 tablet     ezetimibe (ZETIA) 10 MG tablet TAKE ONE TABLET BY MOUTH ONE TIME DAILY 90 tablet 3   Krill Oil 500 MG CAPS See admin instructions.     sildenafil (REVATIO)  20 MG tablet 1 tablet     No current facility-administered medications for this visit.    LABS/IMAGING: No results found for this or any previous visit (from the past 48 hour(s)). No results found.  VITALS: BP 132/78   Pulse 83   Ht 5\' 10"  (1.778 m)   Wt 182 lb 12.8 oz (82.9 kg)   SpO2 97%   BMI 26.23 kg/m   EXAM: General appearance: alert and no distress Neck: no carotid bruit, no JVD and thyroid not enlarged, symmetric, no tenderness/mass/nodules Lungs: clear to auscultation bilaterally Heart: regular rate and rhythm, S1, S2 normal, no murmur, click, rub or gallop Abdomen: soft, non-tender; bowel sounds normal; no masses,  no organomegaly Extremities: extremities normal, atraumatic, no cyanosis or edema Pulses: 2+ and symmetric Skin: Skin color, texture, turgor normal. No rashes or lesions Neurologic: Grossly normal Psych: Pleasant  EKG: Normal sinus rhythm with PACs at 83, LBBB-personally reviewed  ASSESSMENT: Stable LBBB Hyperlipidemia - significantly improved Strong family history of premature coronary disease Tolerating Lipitor and Zetia, despite a prior history of statin intolerance due to myalgias and elevated LFTs (Lipitor, Zocor, Vytorin, Crestor) Infrequent palpitations -PACs  PLAN: 1.   Mr. Mcginness although had recently had an issue with some hip pain.  He was on steroids which may have worsened his palpitations but was not noted to have any A. fib by cardio mobile device.  He has PACs today with a normal sinus rhythm.  I encouraged him to continue to monitor his rhythm with that device if he has palpitations.  His cholesterol is well controlled.  His blood pressure is at goal.  He has a stable left bundle branch block without any heart failure or ischemic symptoms.  Plan follow-up with me annually or sooner as necessary.  Dorris Fetch, MD, [facility], FACP  [facility]  Medical Director of the Advanced Lipid Disorders &  Cardiovascular Risk  Reduction Clinic Diplomate of the American Board of Clinical Lipidology Attending Cardiologist  Direct Dial: [phone]  Fax: [phone]  Website:  [email or web address] 09/20/2021, 10:52 AM

## 2021-09-25 DIAGNOSIS — M25551 Pain in right hip: Secondary | ICD-10-CM | POA: Diagnosis not present

## 2021-10-02 ENCOUNTER — Other Ambulatory Visit: Payer: Self-pay | Admitting: Internal Medicine

## 2021-12-20 DIAGNOSIS — R972 Elevated prostate specific antigen [PSA]: Secondary | ICD-10-CM | POA: Diagnosis not present

## 2021-12-27 DIAGNOSIS — R972 Elevated prostate specific antigen [PSA]: Secondary | ICD-10-CM | POA: Diagnosis not present

## 2021-12-27 DIAGNOSIS — N5201 Erectile dysfunction due to arterial insufficiency: Secondary | ICD-10-CM | POA: Diagnosis not present

## 2021-12-27 DIAGNOSIS — N401 Enlarged prostate with lower urinary tract symptoms: Secondary | ICD-10-CM | POA: Diagnosis not present

## 2021-12-27 DIAGNOSIS — R3915 Urgency of urination: Secondary | ICD-10-CM | POA: Diagnosis not present

## 2022-01-31 ENCOUNTER — Other Ambulatory Visit: Payer: Self-pay | Admitting: Internal Medicine

## 2022-03-20 DIAGNOSIS — R350 Frequency of micturition: Secondary | ICD-10-CM | POA: Diagnosis not present

## 2022-04-06 DIAGNOSIS — H01002 Unspecified blepharitis right lower eyelid: Secondary | ICD-10-CM | POA: Diagnosis not present

## 2022-04-06 DIAGNOSIS — H52203 Unspecified astigmatism, bilateral: Secondary | ICD-10-CM | POA: Diagnosis not present

## 2022-04-06 DIAGNOSIS — H01005 Unspecified blepharitis left lower eyelid: Secondary | ICD-10-CM | POA: Diagnosis not present

## 2022-04-06 DIAGNOSIS — H2513 Age-related nuclear cataract, bilateral: Secondary | ICD-10-CM | POA: Diagnosis not present

## 2022-05-22 DIAGNOSIS — D1801 Hemangioma of skin and subcutaneous tissue: Secondary | ICD-10-CM | POA: Diagnosis not present

## 2022-05-22 DIAGNOSIS — L570 Actinic keratosis: Secondary | ICD-10-CM | POA: Diagnosis not present

## 2022-05-22 DIAGNOSIS — L821 Other seborrheic keratosis: Secondary | ICD-10-CM | POA: Diagnosis not present

## 2022-05-22 DIAGNOSIS — B078 Other viral warts: Secondary | ICD-10-CM | POA: Diagnosis not present

## 2022-05-22 DIAGNOSIS — D225 Melanocytic nevi of trunk: Secondary | ICD-10-CM | POA: Diagnosis not present

## 2022-06-20 DIAGNOSIS — R972 Elevated prostate specific antigen [PSA]: Secondary | ICD-10-CM | POA: Diagnosis not present

## 2022-06-27 DIAGNOSIS — N400 Enlarged prostate without lower urinary tract symptoms: Secondary | ICD-10-CM | POA: Diagnosis not present

## 2022-06-27 DIAGNOSIS — R972 Elevated prostate specific antigen [PSA]: Secondary | ICD-10-CM | POA: Diagnosis not present

## 2022-08-31 DIAGNOSIS — Z23 Encounter for immunization: Secondary | ICD-10-CM | POA: Diagnosis not present

## 2022-08-31 DIAGNOSIS — Z0000 Encounter for general adult medical examination without abnormal findings: Secondary | ICD-10-CM | POA: Diagnosis not present

## 2022-08-31 DIAGNOSIS — N401 Enlarged prostate with lower urinary tract symptoms: Secondary | ICD-10-CM | POA: Diagnosis not present

## 2022-08-31 DIAGNOSIS — Z1331 Encounter for screening for depression: Secondary | ICD-10-CM | POA: Diagnosis not present

## 2022-08-31 DIAGNOSIS — E7800 Pure hypercholesterolemia, unspecified: Secondary | ICD-10-CM | POA: Diagnosis not present

## 2022-08-31 DIAGNOSIS — G720 Drug-induced myopathy: Secondary | ICD-10-CM | POA: Diagnosis not present

## 2022-08-31 DIAGNOSIS — G729 Myopathy, unspecified: Secondary | ICD-10-CM | POA: Diagnosis not present

## 2022-08-31 DIAGNOSIS — Z1211 Encounter for screening for malignant neoplasm of colon: Secondary | ICD-10-CM | POA: Diagnosis not present

## 2022-08-31 DIAGNOSIS — Z5181 Encounter for therapeutic drug level monitoring: Secondary | ICD-10-CM | POA: Diagnosis not present

## 2022-08-31 DIAGNOSIS — Z79899 Other long term (current) drug therapy: Secondary | ICD-10-CM | POA: Diagnosis not present

## 2022-09-19 ENCOUNTER — Other Ambulatory Visit: Payer: Self-pay | Admitting: Internal Medicine

## 2022-09-20 ENCOUNTER — Ambulatory Visit: Payer: Medicare PPO | Admitting: Internal Medicine

## 2022-10-02 ENCOUNTER — Ambulatory Visit: Payer: Medicare PPO | Attending: Internal Medicine | Admitting: Internal Medicine

## 2022-10-02 ENCOUNTER — Encounter: Payer: Self-pay | Admitting: Internal Medicine

## 2022-10-02 VITALS — BP 132/78 | HR 62 | Ht 70.0 in | Wt 187.8 lb

## 2022-10-02 DIAGNOSIS — I447 Left bundle-branch block, unspecified: Secondary | ICD-10-CM | POA: Diagnosis not present

## 2022-10-02 DIAGNOSIS — E785 Hyperlipidemia, unspecified: Secondary | ICD-10-CM

## 2022-10-02 DIAGNOSIS — R002 Palpitations: Secondary | ICD-10-CM | POA: Diagnosis not present

## 2022-10-02 DIAGNOSIS — Z1211 Encounter for screening for malignant neoplasm of colon: Secondary | ICD-10-CM | POA: Diagnosis not present

## 2022-10-02 NOTE — Progress Notes (Signed)
OFFICE NOTE  Chief Complaint:  Follow-up  Primary Care Physician: Emilio Aspen, MD  HPI:  Austin Moore is a pleasant 76 yo male with a strong family history of heart disease with his father having his 1st cardiac event at age 7. In addition to that, he has hyperlipidemia and has been intolerant to statins in the past because of elevated liver functions. He had been able to take Lipitor 10 mg tablets 3 times a week with no change in his liver functions and no myalgias. His blood pressure which is usually under good control away from this office still remains borderline high. He is completely asymptomatic. He has had no chest pain, no unusual shortness of breath, and no evidence of any arrhythmias. He developed a left bundle branch block in 2010 and as a result of that had a stress test performed in April 2010 that was negative for ischemia and showed a 68% ejection fraction. In 2011 the left bundle branch block had resolved and today he again has his left bundle branch block. It seemed to be intermittent but clearly does not appear to be ischemic mediated based on his stress test 2 years ago.   At his last visit, I recommended adding low-dose Lipitor and Zetia back to his regimen. This is do to an elevated lipid profile with him an MR that demonstrated an LDL particle number of 2700. His LDL C. at that time was 213. Over the past 3 months he's increased his exercise as noted a marked improvement in his energy level. He says he's been tolerating his medications and has had no problems with muscle pain or soreness. His recent lipid profile demonstrated total cholesterol 135, triglycerides 77, HDL 39 and LDL 81, a marked improvement in his lipid profile. The remainder of his labs including a CK was normal at 95 an AST and ALT were both within normal limits. Does not appear that he is having the liver enzyme abnormalities she had in the past. He is very pleased with his current medicine  regimen.  Austin Moore returns today for followup of his lipid profile.  There's been a slight increase in cholesterol since his last cholesterol test however LDL particle number is improved. Currently his LDL-P is 1483, down from 2792.  LDL C. is 99, down from 213. Total cholesterol is 148. This demonstrated marked improvement over his previous blood work which is in part due to his exercise, diet and the addition of Zetia. I've been hesitant to increase the dose of Lipitor due to his abnormal liver function and problems with statins in the past. He seems to be tolerating the medication combination currently without side effects. Comprehensive metabolic profile shows normal AST and ALT as well as renal function.  I saw Austin Moore acted in the office. Overall he is doing well denies any chest discomfort or worsening shortness of breath. He's had a pretty good control of his lipid profile but takes Lipitor only 3-4 times a week as well as Zetia. EKG shows a stable left bundle branch block.  05/01/2016  Austin Moore was seen in the office today in follow-up. Overall he seems to be doing quite well. He continues to be active and denies any chest pain or worsening shortness of breath. He reports she's not been as diligent with his medications for cholesterol however a repeat cholesterol profile was drawn today and shows an improvement with an LDL-P of 1255, total cholesterol 147, HDL-C 41, triglycerides  74 and LDL-C 91. He seems to have a stable left bundle branch block. He also reported some intermittent numbness and tingling around the left ear recently.  05/28/2017  Austin Moore was seen today in follow-up. He says he feels fantastic urine he denies any chest pain or worsening shortness of breath. He's recently been decreasing his atorvastatin and ezetimibe for unknown reasons. He says he takes it probably 2 or 3 times a week together. He felt like if he took the medicine more frequently he would have side  effects. Initially blood pressure was elevated 144/88, however recheck was 120/80. He reports being physically active and again is asymptomatic. His EKG shows a stable left bundle branch block.  06/24/2018  Austin Moore returns today for follow-up.  Overall he feels like he is doing well.  He says he stopped his cholesterol medications, in fact all of his medications several months ago.  He related this to "just being obstinate".  Also was interested in seeing what his lipid numbers were without treatment.  He denies any chest pain or worsening shortness of breath.  He remains physically active and is asymptomatic with this.  EKG shows stable left bundle branch block.  08/05/2020  Austin Moore is seen today in follow-up.  I saw him last year via telemedicine visit.  Overall he is been doing well.  He denies any chest pain or worsening shortness of breath.  His total cholesterol 2 years ago was 291, now down to 144 with triglycerides 65, HDL 40 and LDL of 91 on 20 mg atorvastatin and 10 mg of ezetimibe.  He has a stable left bundle branch block.  Denies any heart failure symptoms.  He denies any chest pain with exertion.  He reports very infrequent palpitations.  I wondered if this could be A. fib but has not had any significant event in the last year.  If this recurs, I suggested he might want to consider the cardia mobile device.  09/20/2021  Austin Moore today for follow-up.  He recently saw his primary care provider and subsequently developed a issue with hip pain.  He was treated with anti-inflammatories/steroids and muscle relaxants.  He noted that he had increase in palpitations or what he felt like were hard heartbeats however they have improved somewhat.  He is monitored this with the cardio mobile device.  It suggested that he was not in A. fib rather was having ectopy.  EKG here today shows sinus rhythm with PACs.  Blood pressures is well controlled.  His cholesterols been well treated with LDL less  than 100.  He denies any chest pain or worsening shortness of breath.  He has persistent left bundle branch block but has not had any issues with heart failure or known coronary disease.  10/02/2022  Austin Moore is seen today in follow-up.  He reports improvement in his palpitations.  Interestingly, he does not have left bundle branch block today.  This was seen previously and could be rate dependent.  He says he is decreased his alcohol intake significantly which might be a factor in this.  He feels a lot better as well.  He is due for some repeat lipids which we will order today.  He denies any chest pain or shortness of breath.  PMHx:  Past Medical History:  Diagnosis Date   Family history of heart disease    History of nuclear stress test 02/17/2009   dipyridamole; negative for ischemia, EF 68% - r/t LBBB  Hyperlipidemia    intolerant to statins - elevated LFTs   LBBB (left bundle branch block)     Past Surgical History:  Procedure Laterality Date   biospy  age 34 - 20   neck   CARDIAC CATHETERIZATION  10/1994   after false positive Thallium test - normal L main/LAD; L Cfx is large dominant vessel, RCA non-dominant (Dr. Langston Reusing)    FAMHx:  Family History  Problem Relation Age of Onset   Cancer Mother    Heart disease Mother    Heart attack Father 51   Heart disease Father    Cancer Sister     SOCHx:   reports that he quit smoking about 52 years ago. His smoking use included cigarettes. He has quit using smokeless tobacco. He reports current alcohol use of about 5.0 standard drinks of alcohol per week. He reports that he does not use drugs.  ALLERGIES:  Allergies  Allergen Reactions   Peanut-Containing Drug Products Shortness Of Breath    Feels mild shortness of breath   Lipitor [Atorvastatin]     Elevated liver enzymes   Vytorin [Ezetimibe-Simvastatin]     Elevated liver enzymes   Zocor [Simvastatin]     Elevated liver enzymes    ROS: Pertinent items noted  in HPI and remainder of comprehensive ROS otherwise negative.  HOME MEDS: Current Outpatient Medications  Medication Sig Dispense Refill   atorvastatin (LIPITOR) 20 MG tablet Take 1 tablet (20 mg total) by mouth daily. 90 tablet 2   cholecalciferol (VITAMIN D3) 25 MCG (1000 UNIT) tablet 1 tablet     ezetimibe (ZETIA) 10 MG tablet TAKE ONE TABLET BY MOUTH ONE TIME DAILY 90 tablet 0   Krill Oil 500 MG CAPS See admin instructions.     sildenafil (REVATIO) 20 MG tablet 1 tablet     No current facility-administered medications for this visit.    LABS/IMAGING: No results found for this or any previous visit (from the past 48 hour(s)). No results found.  VITALS: BP 132/78   Pulse 62   Ht 5\' 10"  (1.778 m)   Wt 187 lb 12.8 oz (85.2 kg)   SpO2 99%   BMI 26.95 kg/m   EXAM: General appearance: alert and no distress Neck: no carotid bruit, no JVD and thyroid not enlarged, symmetric, no tenderness/mass/nodules Lungs: clear to auscultation bilaterally Heart: regular rate and rhythm, S1, S2 normal, no murmur, click, rub or gallop Abdomen: soft, non-tender; bowel sounds normal; no masses,  no organomegaly Extremities: extremities normal, atraumatic, no cyanosis or edema Pulses: 2+ and symmetric Skin: Skin color, texture, turgor normal. No rashes or lesions Neurologic: Grossly normal Psych: Pleasant  EKG: Normal sinus rhythm with PVC at 62, nonspecific ST changes personally reviewed  ASSESSMENT: Intermittent LBBB Hyperlipidemia - significantly improved Strong family history of premature coronary disease Tolerating Lipitor and Zetia, despite a prior history of statin intolerance due to myalgias and elevated LFTs (Lipitor, Zocor, Vytorin, Crestor) Infrequent palpitations -PACs  PLAN: 1.   Mr. Cupps seems to be doing well.  He cut back significantly on his alcohol intake.  He does not have a left bundle branch block today suggesting its intermittent and possibly lower risk.  He denies  any chest pain or shortness of breath.  His palpitations have significantly improved.  He is due for some repeat lipids.  Will order those and he will plan to come back tomorrow fasting.  Follow-up with me otherwise annually or sooner as necessary.  Austin Fetch,  MD, Milag[facility] Loll  [place]  [facility]  Medical Director of the Advanced Lipid Disorders &  Cardiovascular Risk Reduction Clinic Diplomate of the American Board of Clinical Lipidology Attending Cardiologist  Direct Dial: [phone]  Fax: [phone]  Website:  [email or web address] 10/02/2022, 4:45 PM

## 2022-10-02 NOTE — Patient Instructions (Signed)
Medication Instructions:  NO CHANGES  *If you need a refill on your cardiac medications before your next appointment, please call your pharmacy*   Lab Work: FASTING LIPID PANEL   If you have labs (blood work) drawn today and your tests are completely normal, you will receive your results only by: MyChart Message (if you have MyChart) OR A paper copy in the mail If you have any lab test that is abnormal or we need to change your treatment, we will call you to review the results.   Testing/Procedures: NONE   Follow-Up: At [facility], you and your health needs are our priority.  As part of our continuing mission to provide you with exceptional heart care, we have created designated Provider Care Teams.  These Care Teams include your primary Cardiologist (physician) and Advanced Practice Providers (APPs -  Physician Assistants and Nurse Practitioners) who all work together to provide you with the care you need, when you need it.  We recommend signing up for the patient portal called "MyChart".  Sign up information is provided on this After Visit Summary.  MyChart is used to connect with patients for Virtual Visits (Telemedicine).  Patients are able to view lab/test results, encounter notes, upcoming appointments, etc.  Non-urgent messages can be sent to your provider as well.   To learn more about what you can do with MyChart, go to [email or web address].    Your next appointment:   12 month(s)  The format for your next appointment:   In Person  Provider:   Zoila Shutter MD

## 2022-10-03 DIAGNOSIS — E785 Hyperlipidemia, unspecified: Secondary | ICD-10-CM | POA: Diagnosis not present

## 2022-10-04 ENCOUNTER — Encounter: Payer: Self-pay | Admitting: Internal Medicine

## 2022-10-04 LAB — LIPID PANEL
Chol/HDL Ratio: 3.4 ratio (ref 0.0–5.0)
Cholesterol, Total: 141 mg/dL (ref 100–199)
HDL: 41 mg/dL (ref >39)
LDL Chol Calc (NIH): 88 mg/dL (ref 0–99)
Triglycerides: 59 mg/dL (ref 0–149)
VLDL Cholesterol Cal: 12 mg/dL (ref 5–40)

## 2022-10-27 ENCOUNTER — Other Ambulatory Visit: Payer: Self-pay | Admitting: Internal Medicine

## 2022-12-20 ENCOUNTER — Telehealth: Payer: Self-pay | Admitting: Internal Medicine

## 2022-12-20 ENCOUNTER — Encounter: Payer: Self-pay | Admitting: Internal Medicine

## 2022-12-20 ENCOUNTER — Encounter: Payer: Self-pay | Admitting: Cardiology

## 2022-12-20 ENCOUNTER — Ambulatory Visit: Payer: Medicare PPO | Attending: Cardiology | Admitting: Cardiology

## 2022-12-20 VITALS — BP 106/62 | HR 158 | Ht 70.0 in | Wt 180.8 lb

## 2022-12-20 DIAGNOSIS — I4819 Other persistent atrial fibrillation: Secondary | ICD-10-CM | POA: Diagnosis not present

## 2022-12-20 MED ORDER — METOPROLOL TARTRATE 25 MG PO TABS: 12.5000 mg | ORAL_TABLET | Freq: Four times a day (QID) | ORAL | 3 refills | Status: DC

## 2022-12-20 MED ORDER — APIXABAN 5 MG PO TABS: 5.0000 mg | ORAL_TABLET | Freq: Two times a day (BID) | ORAL | 6 refills | Status: DC

## 2022-12-20 NOTE — Telephone Encounter (Signed)
Called patient after receiving [employer]; printed and showed to Dr Percival Spanish- DOD- Dr said that he would like to see pt today.  Asked if he could get here by 1130, he said he could. Appt scheduled for today 12/20/22 at 1130

## 2022-12-20 NOTE — Telephone Encounter (Signed)
Spoke with Dr Percival Spanish- DOD- said we really need to see the [facility] strips. Will show Dr strips after they are sent by patient

## 2022-12-20 NOTE — Telephone Encounter (Signed)
Patient c/o Palpitations:  High priority if patient c/o lightheadedness, shortness of breath, or chest pain  How long have you had palpitations/irregular HR/ Afib? Are you having the symptoms now? Since last night and this morning   Are you currently experiencing lightheadedness, SOB or CP? Lightheadedness   Do you have a history of afib (atrial fibrillation) or irregular heart rhythm? No   Have you checked your BP or HR? (document readings if available):  111/68 HR 59 and another reader says 130 BPM  Are you experiencing any other symptoms? No.

## 2022-12-20 NOTE — Patient Instructions (Signed)
Medication Instructions:   START ELIQUIS 5 MG ONE TABLET TWICE DAILY  START METOPROLOL 12.5 MG EVERY 6 HOURS FOR THE NEXT 24 HOUR=1/2 OF THE 25 MG TABLET   *If you need a refill on your cardiac medications before your next appointment, please call your pharmacy*   Follow-Up: At [facility], you and your health needs are our priority.  As part of our continuing mission to provide you with exceptional heart care, we have created designated Provider Care Teams.  These Care Teams include your primary Cardiologist (physician) and Advanced Practice Providers (APPs -  Physician Assistants and Nurse Practitioners) who all work together to provide you with the care you need, when you need it.  We recommend signing up for the patient portal called "MyChart".  Sign up information is provided on this After Visit Summary.  MyChart is used to connect with patients for Virtual Visits (Telemedicine).  Patients are able to view lab/test results, encounter notes, upcoming appointments, etc.  Non-urgent messages can be sent to your provider as well.   To learn more about what you can do with MyChart, go to [email or web address].    Your next appointment:   1 week(s)  Provider:   [facility]

## 2022-12-20 NOTE — Telephone Encounter (Addendum)
Called pt, no answer. Left message and call back number

## 2022-12-20 NOTE — Telephone Encounter (Signed)
Pt called to report that his palpitations have just gotten worse- last night in middle of night - Kardia mobile said possible a-fib at 167 beats per minute, pt states that he has been feeling a little light headed; no sob, no chest pain. BP 130/68   Will speak with DOD and call pt back.

## 2022-12-20 NOTE — Progress Notes (Signed)
Cardiology Office Note   Date:  12/20/2022   ID:  Austin Moore, DOB Jul 24, 1946, MRN [id number]  PCP:  Kathalene Frames, MD  Cardiologist:   None   Chief Complaint  Patient presents with   Atrial Fibrillation      History of Present Illness: Austin Moore is a 77 y.o. male who presents for evaluation of palpitations.  He had a history of previous left bundle branch block.  This was first noted in 2010.  He had a negative ischemia workup at that point.  The first time I see a question of atrial fibrillation was mentioned in 2021.  He had chronic left bundle branch block until the last EKG which was interestingly narrow complex.  It was thought that his left bundle branch block was rate related.  He was added to my schedule today after I saw some rhythm strips look like atrial fibrillation.  He said this started around noon yesterday.  He been having a fair amount of caffeine which he knows to be a trigger.  Today it was still ongoing and his Kardia sent to me and demonstrated atrial fibs with left bundle branch block.  He does not notice it skipping in his chest if he is paying attention.  Has not had any presyncope or syncope.  He has not had any chest pressure, neck or arm discomfort.  Has had no new shortness of breath, PND or orthopnea.  He might have been slightly lightheaded.   Past Medical History:  Diagnosis Date   Family history of heart disease    History of nuclear stress test 02/17/2009   dipyridamole; negative for ischemia, EF 68% - r/t LBBB   Hyperlipidemia    intolerant to statins - elevated LFTs   LBBB (left bundle branch block)     Past Surgical History:  Procedure Laterality Date   biospy  age 70 - 73   neck   CARDIAC CATHETERIZATION  10/1994   after false positive Thallium test - normal L main/LAD; L Cfx is large dominant vessel, RCA non-dominant (Dr. Rockne Menghini)     Current Outpatient Medications  Medication Sig Dispense Refill    atorvastatin (LIPITOR) 20 MG tablet TAKE ONE TABLET BY MOUTH ONE TIME DAILY 90 tablet 3   cholecalciferol (VITAMIN D3) 25 MCG (1000 UNIT) tablet 1 tablet     ezetimibe (ZETIA) 10 MG tablet TAKE ONE TABLET BY MOUTH ONE TIME DAILY 90 tablet 0   Krill Oil 500 MG CAPS See admin instructions.     sildenafil (REVATIO) 20 MG tablet Take 20 mg by mouth daily as needed.     apixaban (ELIQUIS) 5 MG TABS tablet Take 1 tablet (5 mg total) by mouth 2 (two) times daily. 60 tablet 6   metoprolol tartrate (LOPRESSOR) 25 MG tablet Take 0.5 tablets (12.5 mg total) by mouth every 6 (six) hours. 45 tablet 3   No current facility-administered medications for this visit.    Allergies:   Peanut-containing drug products, Lipitor [atorvastatin], Vytorin [ezetimibe-simvastatin], and Zocor [simvastatin]    ROS:  Please see the history of present illness.   Otherwise, review of systems are positive for none.   All other systems are reviewed and negative.    PHYSICAL EXAM: VS:  BP 106/62 (BP Location: Left Arm, Patient Position: Sitting, Cuff Size: Normal)   Pulse (!) 158   Ht 5' 10"$  (1.778 m)   Wt 180 lb 12.8 oz (82 kg)  SpO2 97%   BMI 25.94 kg/m  , BMI Body mass index is 25.94 kg/m. GENERAL:  Well appearing HEENT:  Pupils equal round and reactive, fundi not visualized, oral mucosa unremarkable NECK:  No jugular venous distention, waveform within normal limits, carotid upstroke brisk and symmetric, no bruits, no thyromegaly LYMPHATICS:  No cervical, inguinal adenopathy LUNGS:  Clear to auscultation bilaterally BACK:  No CVA tenderness CHEST:  Unremarkable HEART:  PMI not displaced or sustained,S1 and S2 within normal limits, no S3, no clicks, no rubs, slight systolic murmur at the apex and nonradiating, no diastolic murmurs, irregular ABD:  Flat, positive bowel sounds normal in frequency in pitch, no bruits, no rebound, no guarding, no midline pulsatile mass, no hepatomegaly, no splenomegaly EXT:  2 plus  pulses throughout, no edema, no cyanosis no clubbing SKIN:  No rashes no nodules NEURO:  Cranial nerves II through XII grossly intact, motor grossly intact throughout PSYCH:  Cognitively intact, oriented to person place and time    EKG:  EKG is ordered today. The ekg ordered today demonstrates atrial fibrillation, rate 158, left bundle branch block, left axis deviation   Recent Labs: No results found for requested labs within last 365 days.    Lipid Panel    Component Value Date/Time   CHOL 141 10/03/2022 0820   CHOL 147 04/24/2016 0815   CHOL 148 01/27/2014 0831   TRIG 59 10/03/2022 0820   TRIG 74 04/24/2016 0815   TRIG 67 01/27/2014 0831   HDL 41 10/03/2022 0820   HDL 41 04/24/2016 0815   HDL 36 (L) 01/27/2014 0831   CHOLHDL 3.4 10/03/2022 0820   CHOLHDL 3.6 04/24/2016 0815   CHOLHDL 4.6 02/28/2015 0922   VLDL 19 02/28/2015 0922   LDLCALC 88 10/03/2022 0820   LDLCALC 91 04/24/2016 0815   LDLCALC 99 01/27/2014 0831      Wt Readings from Last 3 Encounters:  12/20/22 180 lb 12.8 oz (82 kg)  10/02/22 187 lb 12.8 oz (85.2 kg)  09/20/21 182 lb 12.8 oz (82.9 kg)      Other studies Reviewed: Additional studies/ records that were reviewed today include: Previous records. Review of the above records demonstrates:  Please see elsewhere in the note.     ASSESSMENT AND PLAN:  ATRIAL FIB: The patient is in atrial fibrillation although not particularly symptomatic with this.  I am going to start Eliquis 5 mg twice daily.  I will check blood work to include CBC and TSH as well as basic metabolic profile.  Going to give him metoprolol 12 and half milligrams every 6 hours.  He can send [place] rhythm strips tomorrow.  Hopefully he will convert to release be rate controlled.  I am can have him follow-up in the atrial fibrillation clinic.  When his rate is slower we can repeat an EKG.  We talked about the potential need for cardioversion in the future or even the role of ablation but we  are not at that point.  He recognizes triggers and he has for a long time for related.  And now will be avoiding caffeine.  LBBB:   This has been a regular rhythm apparently.  This will be followed by Dr. Debara Pickett going forward.    Current medicines are reviewed at length with the patient today.  The patient does not have concerns regarding medicines.  The following changes have been made: As above  Labs/ tests ordered today include:   Orders Placed This Encounter  Procedures  EKG 12-Lead     Disposition:   FU with Atrial Fib Clinic next week.      Signed, Minus Breeding, MD  12/20/2022 1:11 PM    [place]

## 2022-12-20 NOTE — Telephone Encounter (Signed)
Called pt to see if he can send [facility] readings.

## 2022-12-21 ENCOUNTER — Encounter: Payer: Self-pay | Admitting: Cardiology

## 2022-12-21 MED ORDER — METOPROLOL TARTRATE 25 MG PO TABS: 12.5000 mg | ORAL_TABLET | Freq: Four times a day (QID) | ORAL | 3 refills | Status: AC | PRN

## 2022-12-21 NOTE — Telephone Encounter (Signed)
Spoke with pt, he is back in sinus rhythm and is feeling "normal." His metoprolol is currently 12.5 mg every 8 hours. His blood pressure this morning is 117/68/67. Discussed with dr hochrein and the patient instructed to reduce metoprolol to as needed. Patient voiced understanding.

## 2022-12-25 ENCOUNTER — Ambulatory Visit (HOSPITAL_COMMUNITY): Payer: Medicare PPO | Admitting: Physician Assistant

## 2023-01-03 ENCOUNTER — Telehealth: Payer: Self-pay | Admitting: Cardiology

## 2023-01-03 NOTE — Telephone Encounter (Signed)
Advised patient to keep appointment in afib clinic so Normal Sinus rhythm as reported by Jodelle Red mobile can be documented and medications can be adjusted as needed per Dr. Rosezella [place] note on 12/20/22. Patient verbalizes understanding.

## 2023-01-03 NOTE — Telephone Encounter (Signed)
Pt states he was referred to afib clinic and he is scheduled for 01/08/23. Pt states he has been asymptomatic and wants to know if Dr. Percival Spanish still wants him to go.

## 2023-01-08 ENCOUNTER — Ambulatory Visit (HOSPITAL_COMMUNITY)
Admission: RE | Admit: 2023-01-08 | Discharge: 2023-01-08 | Disposition: A | Discharge status: Home / Self Care | Payer: Medicare PPO | Source: Ambulatory Visit | Attending: Nurse Practitioner | Admitting: Nurse Practitioner

## 2023-01-08 VITALS — BP 152/86 | HR 65 | Ht 70.0 in | Wt 179.0 lb

## 2023-01-08 DIAGNOSIS — I480 Paroxysmal atrial fibrillation: Secondary | ICD-10-CM

## 2023-01-08 DIAGNOSIS — Z7901 Long term (current) use of anticoagulants: Secondary | ICD-10-CM | POA: Insufficient documentation

## 2023-01-08 DIAGNOSIS — D6869 Other thrombophilia: Secondary | ICD-10-CM | POA: Diagnosis not present

## 2023-01-08 DIAGNOSIS — I447 Left bundle-branch block, unspecified: Secondary | ICD-10-CM | POA: Insufficient documentation

## 2023-01-08 DIAGNOSIS — I4891 Unspecified atrial fibrillation: Secondary | ICD-10-CM | POA: Insufficient documentation

## 2023-01-08 NOTE — Progress Notes (Addendum)
Primary Care Physician: Kathalene Frames, MD Referring Physician: Dr. Heloise Beecham is a 77 y.o. male with a h/o afib/LBBB, that Dr. Percival Spanish saw as a work for palpitations. He was found to be in a rapid  afib with LBBB at 158 bpm. He was minimally symptomatic. He was started on eliquis 5 mg bid and metoprolol 12.5 mg q 6 hours for the next 6 hours. He was referred to the afib clinic. He was drinking a large amount of caffeine. Rare alcohol, no snoring history. CHA2DS2VASc  score of 2.   Today, he is in SR. He used the BB for a few doses and the palpitations went away. He continues on eliquis.   Today, he denies symptoms of palpitations, chest pain, shortness of breath, orthopnea, PND, lower extremity edema, dizziness, presyncope, syncope, or neurologic sequela. The patient is tolerating medications without difficulties and is otherwise without complaint today.   Past Medical History:  Diagnosis Date   Family history of heart disease    History of nuclear stress test 02/17/2009   dipyridamole; negative for ischemia, EF 68% - r/t LBBB   Hyperlipidemia    intolerant to statins - elevated LFTs   LBBB (left bundle branch block)    Past Surgical History:  Procedure Laterality Date   biospy  age 17 - 55   neck   CARDIAC CATHETERIZATION  10/1994   after false positive Thallium test - normal L main/LAD; L Cfx is large dominant vessel, RCA non-dominant (Dr. Rockne Menghini)    Current Outpatient Medications  Medication Sig Dispense Refill   apixaban (ELIQUIS) 5 MG TABS tablet Take 1 tablet (5 mg total) by mouth 2 (two) times daily. 60 tablet 6   atorvastatin (LIPITOR) 20 MG tablet TAKE ONE TABLET BY MOUTH ONE TIME DAILY 90 tablet 3   cholecalciferol (VITAMIN D3) 25 MCG (1000 UNIT) tablet 1 tablet     ezetimibe (ZETIA) 10 MG tablet TAKE ONE TABLET BY MOUTH ONE TIME DAILY 90 tablet 0   Krill Oil 500 MG CAPS See admin instructions.     metoprolol tartrate (LOPRESSOR) 25 MG  tablet Take 0.5 tablets (12.5 mg total) by mouth every 6 (six) hours as needed. 45 tablet 3   sildenafil (REVATIO) 20 MG tablet Take 20 mg by mouth daily as needed.     No current facility-administered medications for this encounter.    Allergies  Allergen Reactions   Peanut-Containing Drug Products Shortness Of Breath    Feels mild shortness of breath   Lipitor [Atorvastatin]     Elevated liver enzymes   Vytorin [Ezetimibe-Simvastatin]     Elevated liver enzymes   Zocor [Simvastatin]     Elevated liver enzymes    Social History   Socioeconomic History   Marital status: Married    Spouse name: Not on file   Number of children: 1   Years of education: grad   Highest education level: Not on file  Occupational History    Employer: [employer]  Tobacco Use   Smoking status: Former    Years: 6.00    Types: Cigarettes    Quit date: 05/26/1970    Years since quitting: 52.6   Smokeless tobacco: Former  Substance and Sexual Activity   Alcohol use: Yes    Alcohol/week: 5.0 standard drinks of alcohol    Types: 5 drink(s) per week   Drug use: No   Sexual activity: Not on file  Other Topics Concern  Not on file  Social History Narrative   Not on file   Social Determinants of Health   Financial Resource Strain: Not on file  Food Insecurity: Not on file  Transportation Needs: Not on file  Physical Activity: Not on file  Stress: Not on file  Social Connections: Not on file  Intimate Partner Violence: Not on file    Family History  Problem Relation Age of Onset   Cancer Mother    Heart disease Mother    Heart attack Father 12   Heart disease Father    Cancer Sister     ROS- All systems are reviewed and negative except as per the HPI above  Physical Exam: Vitals:   01/08/23 0833  Height: '5\' 10"'$  (1.778 m)   Wt Readings from Last 3 Encounters:  12/20/22 82 kg  10/02/22 85.2 kg  09/20/21 82.9 kg    Labs: Lab Results  Component Value Date   NA 140  04/24/2016   K 4.5 04/24/2016   CL 105 04/24/2016   CO2 25 04/24/2016   GLUCOSE 90 04/24/2016   BUN 15 04/24/2016   CREATININE 1.04 04/24/2016   CALCIUM 9.0 04/24/2016   MG 2.1 08/11/2013   No results found for: "INR" Lab Results  Component Value Date   CHOL 141 10/03/2022   HDL 41 10/03/2022   LDLCALC 88 10/03/2022   TRIG 59 10/03/2022     GEN- The patient is well appearing, alert and oriented x 3 today.   Head- normocephalic, atraumatic Eyes-  Sclera clear, conjunctiva pink Ears- hearing intact Oropharynx- clear Neck- supple, no JVP Lymph- no cervical lymphadenopathy Lungs- Clear to ausculation bilaterally, normal work of breathing Heart- Regular rate and rhythm, no murmurs, rubs or gallops, PMI not laterally displaced GI- soft, NT, ND, + BS Extremities- no clubbing, cyanosis, or edema MS- no significant deformity or atrophy Skin- no rash or lesion Psych- euthymic mood, full affect Neuro- strength and sensation are intact  EKG-Vent. rate 65 BPM PR interval 158 ms QRS duration 86 ms QT/QTcB 396/411 ms P-R-T axes 68 58 66 Normal sinus rhythm with sinus arrhythmia Anterior infarct , age undetermined Abnormal ECG No previous ECGs available    Assessment and Plan:  1. Afib  New dx  He is in SR today  General education re afib and triggers  He has metoprolol to use as needed and we discussed how to take for episodes of palpitations/afib He has a Kardia to use to track at home  He is minimizing caffeine Echo   2. CHA2DS2VASc  score of 2(age)  Continue eliquis 5 mg bid  Bleeding precautions  discussed  Cbc/bmet will be drawn at time of echo to f/u eliquis start   F/u with Dr. Debara Pickett as scheduled  Afib clinic as needed   Butch Penny C. Alexsa Flaum, [facility] [address] [place], [place] [address] [phone]

## 2023-01-10 ENCOUNTER — Other Ambulatory Visit: Payer: Self-pay | Admitting: Internal Medicine

## 2023-01-28 ENCOUNTER — Ambulatory Visit (HOSPITAL_COMMUNITY)
Admission: RE | Admit: 2023-01-28 | Discharge: 2023-01-28 | Disposition: A | Discharge status: Home / Self Care | Payer: Medicare PPO | Source: Ambulatory Visit | Attending: Nurse Practitioner | Admitting: Nurse Practitioner

## 2023-01-28 DIAGNOSIS — I081 Rheumatic disorders of both mitral and tricuspid valves: Secondary | ICD-10-CM | POA: Diagnosis not present

## 2023-01-28 DIAGNOSIS — I4891 Unspecified atrial fibrillation: Secondary | ICD-10-CM | POA: Diagnosis not present

## 2023-01-28 DIAGNOSIS — E785 Hyperlipidemia, unspecified: Secondary | ICD-10-CM | POA: Diagnosis not present

## 2023-01-28 DIAGNOSIS — I447 Left bundle-branch block, unspecified: Secondary | ICD-10-CM | POA: Diagnosis not present

## 2023-01-28 LAB — ECHOCARDIOGRAM COMPLETE
Area-P 1/2: 3.12 cm2
Calc EF: 55.4 %
S' Lateral: 2.6 cm
Single Plane A2C EF: 59.6 %
Single Plane A4C EF: 53.8 %

## 2023-01-28 NOTE — Progress Notes (Signed)
Echocardiogram 2D Echocardiogram has been performed.  Austin Moore 01/28/2023, 4:01 PM

## 2023-02-01 ENCOUNTER — Encounter (HOSPITAL_COMMUNITY): Payer: Self-pay | Admitting: *Deleted

## 2023-02-07 ENCOUNTER — Encounter: Payer: Self-pay | Admitting: Internal Medicine

## 2023-02-14 ENCOUNTER — Ambulatory Visit: Payer: Medicare PPO | Admitting: Internal Medicine

## 2023-04-17 DIAGNOSIS — H52203 Unspecified astigmatism, bilateral: Secondary | ICD-10-CM | POA: Diagnosis not present

## 2023-04-17 DIAGNOSIS — H2513 Age-related nuclear cataract, bilateral: Secondary | ICD-10-CM | POA: Diagnosis not present

## 2023-06-03 DIAGNOSIS — L820 Inflamed seborrheic keratosis: Secondary | ICD-10-CM | POA: Diagnosis not present

## 2023-06-03 DIAGNOSIS — L821 Other seborrheic keratosis: Secondary | ICD-10-CM | POA: Diagnosis not present

## 2023-06-03 DIAGNOSIS — L570 Actinic keratosis: Secondary | ICD-10-CM | POA: Diagnosis not present

## 2023-06-03 DIAGNOSIS — D225 Melanocytic nevi of trunk: Secondary | ICD-10-CM | POA: Diagnosis not present

## 2023-06-03 DIAGNOSIS — Z85828 Personal history of other malignant neoplasm of skin: Secondary | ICD-10-CM | POA: Diagnosis not present

## 2023-06-05 ENCOUNTER — Telehealth: Payer: Self-pay | Admitting: Internal Medicine

## 2023-06-05 NOTE — Telephone Encounter (Signed)
Patient is requesting to switch from Dr. Rennis Golden to Dr. Antoine Poche. Please advise.

## 2023-06-11 ENCOUNTER — Telehealth: Payer: Self-pay | Admitting: Cardiology

## 2023-06-11 DIAGNOSIS — I4819 Other persistent atrial fibrillation: Secondary | ICD-10-CM

## 2023-06-11 NOTE — Telephone Encounter (Signed)
Pt is calling for possible lab orders to have done prior to his office visit with Dr. Antoine Poche on 08/29/23. Pt specifically mentions cholesterol levels. Explained that I would forward this message to Dr. Antoine Poche to advise.

## 2023-06-11 NOTE — Telephone Encounter (Signed)
Pt called in to make an appt with Dr. Antoine Poche. He wants to know if he can have his lab drawn prior to.

## 2023-06-12 DIAGNOSIS — R972 Elevated prostate specific antigen [PSA]: Secondary | ICD-10-CM | POA: Diagnosis not present

## 2023-06-12 NOTE — Telephone Encounter (Signed)
Returned call to pt. Let him know per Dr. Antoine Poche pt can have his labs drawn before his appt. Pt verbalized understanding. Let pt know labs have been ordered.

## 2023-06-13 ENCOUNTER — Other Ambulatory Visit: Payer: Self-pay | Admitting: *Deleted

## 2023-06-13 DIAGNOSIS — I4819 Other persistent atrial fibrillation: Secondary | ICD-10-CM

## 2023-06-13 NOTE — Telephone Encounter (Signed)
Lab orders mailed to the pt.  

## 2023-06-20 DIAGNOSIS — N401 Enlarged prostate with lower urinary tract symptoms: Secondary | ICD-10-CM | POA: Diagnosis not present

## 2023-06-20 DIAGNOSIS — R972 Elevated prostate specific antigen [PSA]: Secondary | ICD-10-CM | POA: Diagnosis not present

## 2023-06-20 DIAGNOSIS — R350 Frequency of micturition: Secondary | ICD-10-CM | POA: Diagnosis not present

## 2023-06-29 ENCOUNTER — Encounter: Payer: Self-pay | Admitting: Cardiology

## 2023-07-11 DIAGNOSIS — I4819 Other persistent atrial fibrillation: Secondary | ICD-10-CM | POA: Diagnosis not present

## 2023-07-15 ENCOUNTER — Encounter: Payer: Self-pay | Admitting: *Deleted

## 2023-07-17 DIAGNOSIS — I4819 Other persistent atrial fibrillation: Secondary | ICD-10-CM | POA: Insufficient documentation

## 2023-07-17 NOTE — Progress Notes (Unsigned)
  Cardiology Office Note:   Date:  07/18/2023  ID:  Austin Moore, DOB [birth year], MRN [id number] PCP: Emilio Aspen, MD  [facility] Providers Cardiologist:  Rollene Rotunda, MD {  History of Present Illness:   Austin Moore is a 77 y.o. male who presents for evaluation of palpitations.  He had a history of previous left bundle branch block.  This was first noted in 2010.  He had a negative ischemia workup at that point.  The first time I see a question of atrial fibrillation was mentioned in 2021.  He had chronic left bundle branch block until the last EKG which was interestingly narrow complex.  It was thought that his left bundle branch block was rate related.  I saw him last time with new onset atrial fib.    He has been monitoring himself routinely with his wearable and has had no further A-fib.  He has avoided caffeine and alcohol which he thinks is his trigger.  He is taking a blood thinner and has had no symptoms related to this. The patient denies any new symptoms such as chest discomfort, neck or arm discomfort. There has been no new shortness of breath, PND or orthopnea. There have been no reported palpitations, presyncope or syncope.   ROS: As stated in the HPI and negative for all other systems.  Studies Reviewed:    EKG:   NA  Risk Assessment/Calculations:    CHA2DS2-VASc Score = 2   This indicates a 2.2% annual risk of stroke. The patient's score is based upon: CHF History: 0 HTN History: 0 Diabetes History: 0 Stroke History: 0 Vascular Disease History: 0 Age Score: 2 Gender Score: 0    Physical Exam:   VS:  BP 128/72 (BP Location: Left Arm, Patient Position: Sitting, Cuff Size: Normal)   Pulse 83   Ht 5\' 10"  (1.778 m)   Wt 186 lb 12.8 oz (84.7 kg)   SpO2 96%   BMI 26.80 kg/m    Wt Readings from Last 3 Encounters:  07/18/23 186 lb 12.8 oz (84.7 kg)  01/08/23 179 lb (81.2 kg)  12/20/22 180 lb 12.8 oz (82 kg)     GEN: Well nourished,  well developed in no acute distress NECK: No JVD; No carotid bruits CARDIAC: RRR, no murmurs, rubs, gallops RESPIRATORY:  Clear to auscultation without rales, wheezing or rhonchi  ABDOMEN: Soft, non-tender, non-distended EXTREMITIES:  No edema; No deformity   ASSESSMENT AND PLAN:   ATRIAL FIB: We had a long conversation about this.  It is very likely that he has a low burden of A-fib because he is avoiding triggers.  His CHA2DS2-VASc is 2.  I think it is unlikely that he is having any sustained episodes greater than 24 hours that are going unnoticed.  This probably low likelihood that he is having episodes between 6 minutes and 24 hours.  He is probably in the lowest risk group in which case he would be reasonable to discontinue the Eliquis.  He and I had this conversation in detail and he is going to further monitor his fibrillation.  He will let me know via MyChart in a while if he sees no further fibrillation and wants to stop his Eliquis.  Other meds will remain as listed.    LBBB: This is been rate related.  No change in therapy.     Follow up with me in one year.   Signed, Rollene Rotunda, MD

## 2023-07-18 ENCOUNTER — Ambulatory Visit: Payer: Medicare PPO | Attending: Cardiology | Admitting: Cardiology

## 2023-07-18 ENCOUNTER — Encounter: Payer: Self-pay | Admitting: Cardiology

## 2023-07-18 VITALS — BP 128/72 | HR 83 | Ht 70.0 in | Wt 186.8 lb

## 2023-07-18 DIAGNOSIS — I447 Left bundle-branch block, unspecified: Secondary | ICD-10-CM

## 2023-07-18 DIAGNOSIS — I4819 Other persistent atrial fibrillation: Secondary | ICD-10-CM

## 2023-07-18 NOTE — Patient Instructions (Addendum)
Medication Instructions:   No changes   *If you need a refill on your cardiac medications before your next appointment, please call your pharmacy*   Lab Work:  Not needed     Testing/Procedures:  Not needed  Follow-Up: At [facility], you and your health needs are our priority.  As part of our continuing mission to provide you with exceptional heart care, we have created designated Provider Care Teams.  These Care Teams include your primary Cardiologist (physician) and Advanced Practice Providers (APPs -  Physician Assistants and Nurse Practitioners) who all work together to provide you with the care you need, when you need it.  We recommend signing up for the patient portal called "MyChart".  Sign up information is provided on this After Visit Summary.  MyChart is used to connect with patients for Virtual Visits (Telemedicine).  Patients are able to view lab/test results, encounter notes, upcoming appointments, etc.  Non-urgent messages can be sent to your provider as well.   To learn more about what you can do with MyChart, go to [email or web address].    Your next appointment:   12 month(s)  The format for your next appointment:   In Person  Provider:   James Hochrein, MD     

## 2023-08-26 DIAGNOSIS — E7800 Pure hypercholesterolemia, unspecified: Secondary | ICD-10-CM | POA: Diagnosis not present

## 2023-08-29 ENCOUNTER — Ambulatory Visit: Payer: Medicare PPO | Admitting: Cardiology

## 2023-08-29 ENCOUNTER — Other Ambulatory Visit: Payer: Self-pay | Admitting: Internal Medicine

## 2023-09-03 DIAGNOSIS — Z0000 Encounter for general adult medical examination without abnormal findings: Secondary | ICD-10-CM | POA: Diagnosis not present

## 2023-09-03 DIAGNOSIS — Z1331 Encounter for screening for depression: Secondary | ICD-10-CM | POA: Diagnosis not present

## 2023-09-03 DIAGNOSIS — I4819 Other persistent atrial fibrillation: Secondary | ICD-10-CM | POA: Diagnosis not present

## 2023-09-03 DIAGNOSIS — E7800 Pure hypercholesterolemia, unspecified: Secondary | ICD-10-CM | POA: Diagnosis not present

## 2023-09-03 DIAGNOSIS — D6869 Other thrombophilia: Secondary | ICD-10-CM | POA: Diagnosis not present

## 2023-09-03 DIAGNOSIS — G720 Drug-induced myopathy: Secondary | ICD-10-CM | POA: Diagnosis not present

## 2023-09-03 DIAGNOSIS — Z5181 Encounter for therapeutic drug level monitoring: Secondary | ICD-10-CM | POA: Diagnosis not present

## 2023-09-03 DIAGNOSIS — Z23 Encounter for immunization: Secondary | ICD-10-CM | POA: Diagnosis not present

## 2023-09-03 DIAGNOSIS — N401 Enlarged prostate with lower urinary tract symptoms: Secondary | ICD-10-CM | POA: Diagnosis not present

## 2023-09-27 ENCOUNTER — Other Ambulatory Visit: Payer: Self-pay | Admitting: Cardiology

## 2023-09-27 NOTE — Telephone Encounter (Signed)
Pt last saw Dr Antoine Poche 07/18/23, last labs 07/11/23 Creat 0.91, age 77, weight 84.7kg, based on specified criteria pt is on appropriate dosage of Eliquis 5mg  BID for afib.  Will refill rx.

## 2023-11-06 IMAGING — MR MR HIP*R* W/O CM
5 series · 32 of 40 positions shown · non-contrast
Comparison: Overlapping portion of MRI prostate from 12/14/2020

CLINICAL DATA: Low back pain and radiating to the right hip and
right leg. Weakness and pain for 4 weeks.

EXAM:
MR OF THE RIGHT HIP WITHOUT CONTRAST
TECHNIQUE: Multiplanar, multisequence MR imaging was performed. No intravenous
contrast was administered.

[Series 3: T1 · coronal · 4.0mm · 0.74mm/px · 7 of 27 slices shown]
[im 1/27]
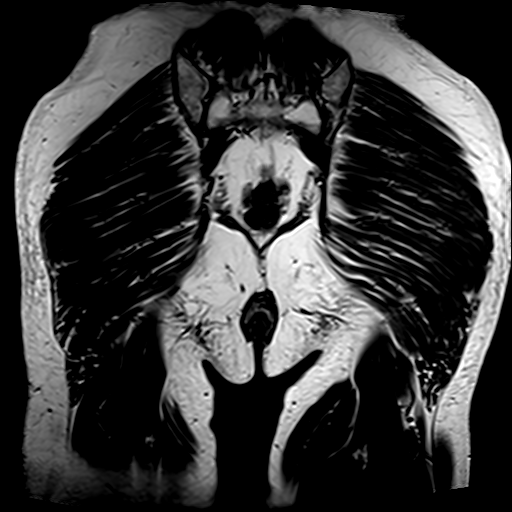
[im 5/27]
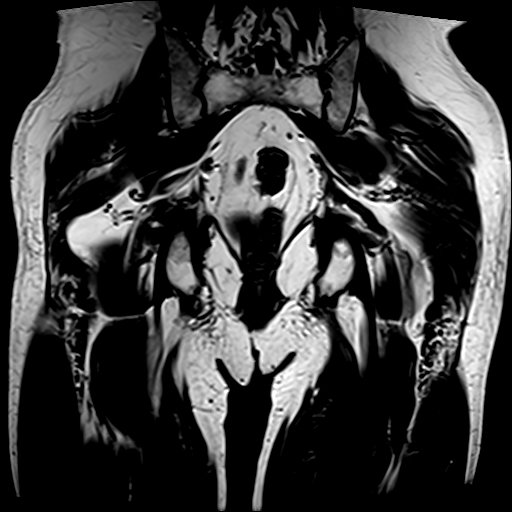
[im 9/27]
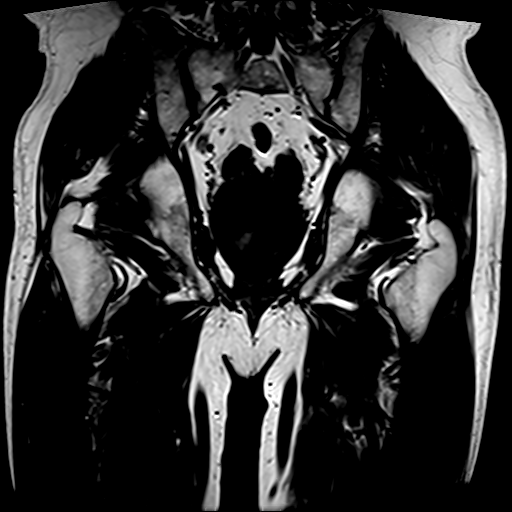
[im 14/27]
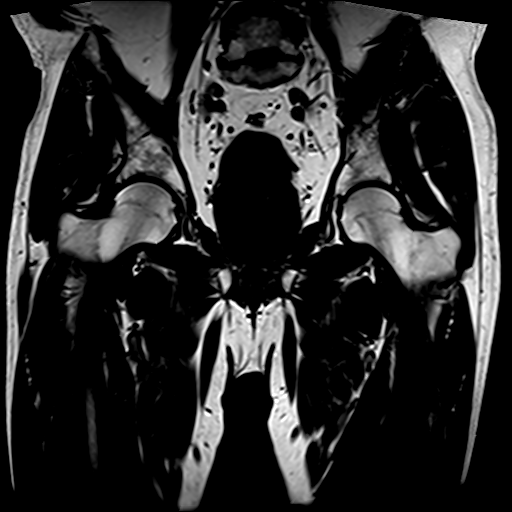
[im 18/27]
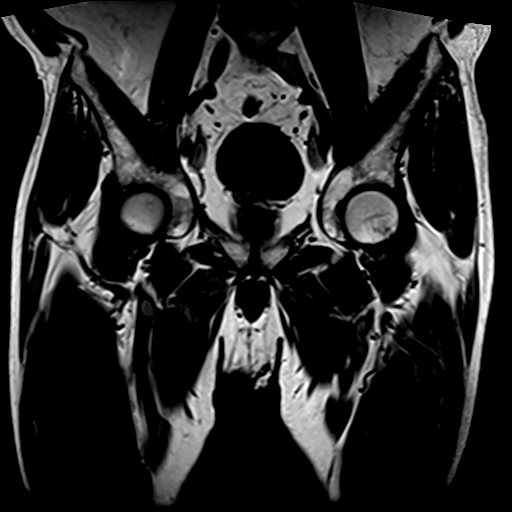
[im 22/27]
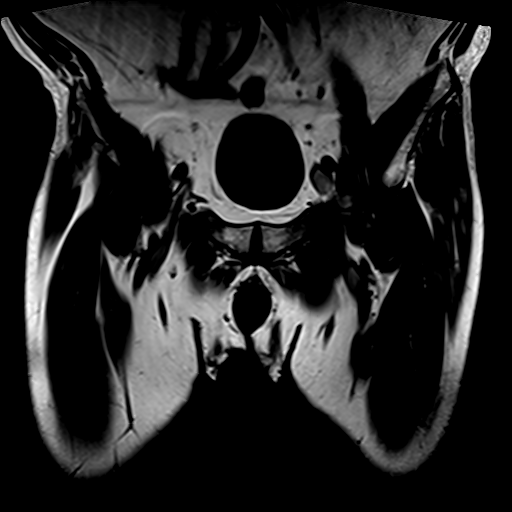
[im 27/27]
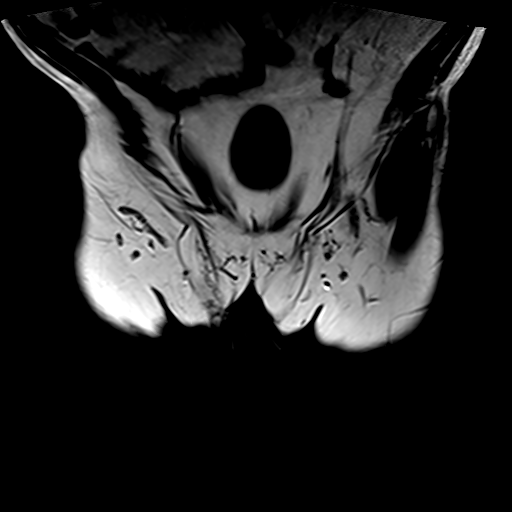

[Series 4: T2 fat-sat · coronal · 4.0mm · 1.48mm/px · 7 of 27 slices shown (1 of 2)]
[im 1/27]
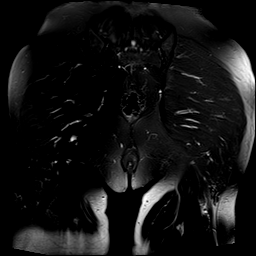
[im 5/27]
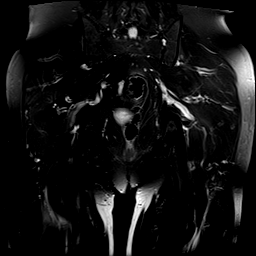
[im 9/27]
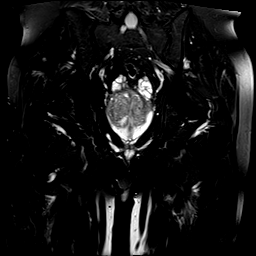
[im 14/27]
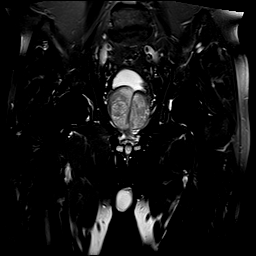
[im 18/27]
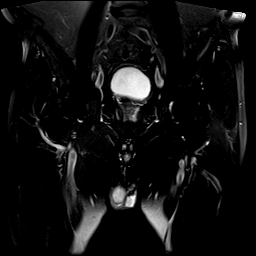
[im 22/27]
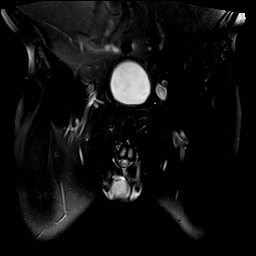
[im 27/27]
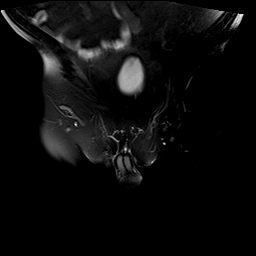

[Series 5: T2 fat-sat · axial · 4.0mm · 0.78mm/px · z∈[-145,+18]mm · 8 of 35 slices shown (2 of 2)]
[im 1/35]
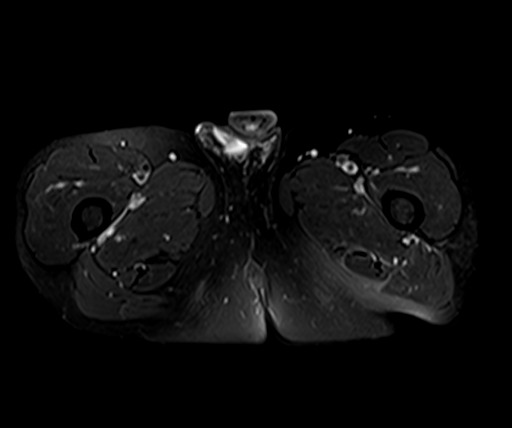
[im 4/35]
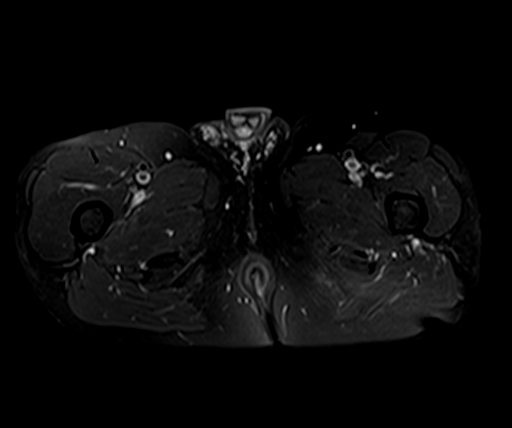
[im 12/35]
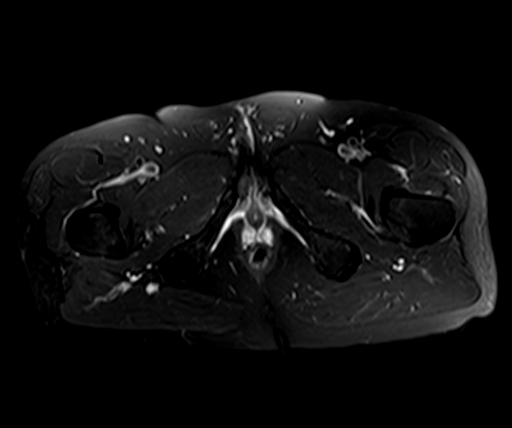
[im 16/35]
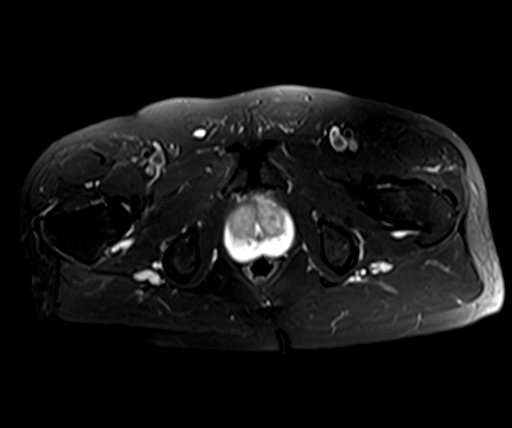
[im 19/35]
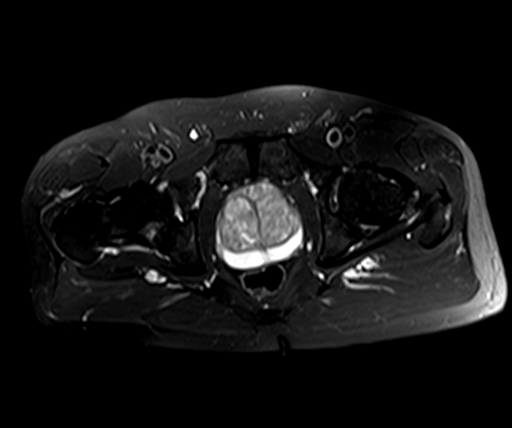
[im 23/35]
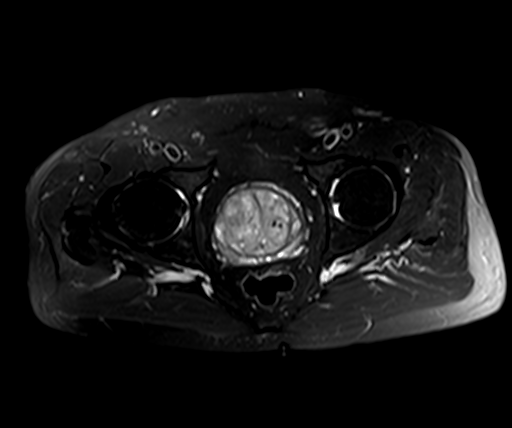
[im 31/35]
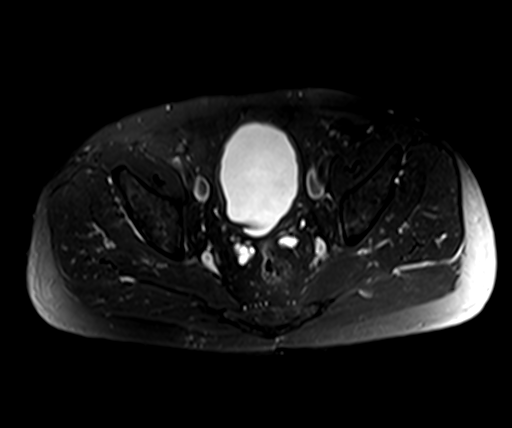
[im 35/35]
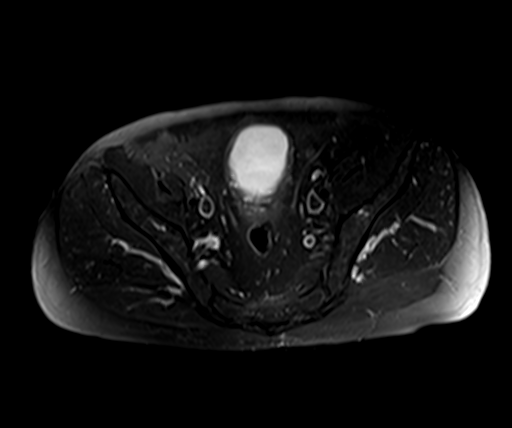

[Series 6: PD fat-sat · coronal · 4.0mm · 0.78mm/px · 7 of 25 slices shown (1 of 2)]
[im 1/25]
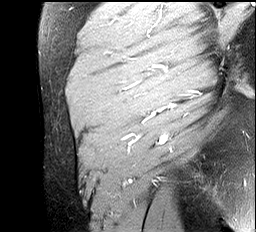
[im 5/25]
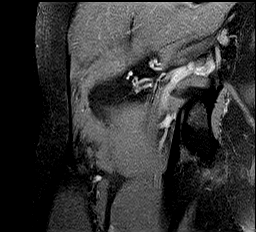
[im 9/25]
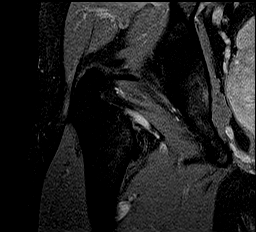
[im 13/25]
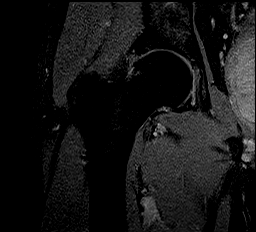
[im 17/25]
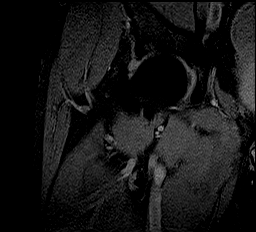
[im 21/25]
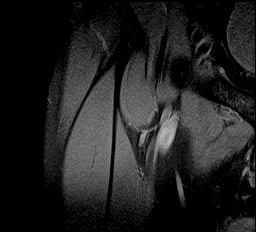
[im 25/25]
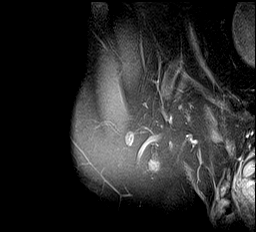

[Series 7: PD fat-sat · sagittal · 4.0mm · 0.74mm/px · 3 of 32 slices shown (2 of 2)]
[im 1/32]
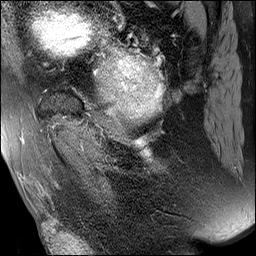
[im 4/32]
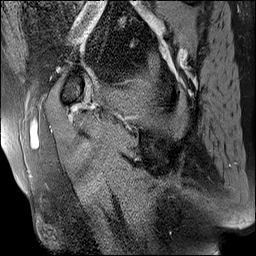
[im 8/32]
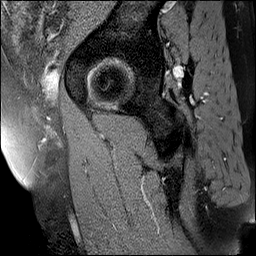

[32 of 40 positions shown; findings below may reference images not displayed]

FINDINGS: Bones: Small degenerative subcortical cysts and subtle adjacent
marrow edema along the acetabular roof for example image 15 series 4
and image 19 series 7. Spurring of both acetabula and femoral heads,
mildly greater on the right than the left. Slightly aspherical right
femoral head predisposing to cam type femoroacetabular impingement.

Small degenerative subcortical cystic lesion along the iliac side of
the left SI joint on image 4 series 4.

Note is made of spondylosis and degenerative endplate findings at
L5-S1, better characterized on the dedicated lumbar spine MRI.

Articular cartilage and labrum

Articular cartilage: Moderate right and mild left degenerative
chondral thinning in the hips. No focal chondral defect identified.

Labrum: Expansion and accentuated signal in the superior labrum for
example on image 14 series 6, suspicious for degenerative labral
tearing.

Joint or bursal effusion

Joint effusion:  Absent

Bursae: No regional bursitis

Muscles and tendons

Muscles and tendons:  Unremarkable

Other findings

Miscellaneous:   Prostatomegaly.
IMPRESSION: 1. Moderate degenerative arthropathy of the right hip with loss of
articular space and spurring. Mildly aspherical right femoral head
may predispose to cam type femoroacetabular impingement.
2. Expansion and diffuse accentuated signal in the superior labrum
raising suspicion for degenerative tearing.
3. Prostatomegaly.

## 2023-12-24 ENCOUNTER — Other Ambulatory Visit: Payer: Self-pay | Admitting: Internal Medicine

## 2024-01-16 DIAGNOSIS — R972 Elevated prostate specific antigen [PSA]: Secondary | ICD-10-CM | POA: Diagnosis not present

## 2024-01-23 DIAGNOSIS — R972 Elevated prostate specific antigen [PSA]: Secondary | ICD-10-CM | POA: Diagnosis not present

## 2024-01-23 DIAGNOSIS — R350 Frequency of micturition: Secondary | ICD-10-CM | POA: Diagnosis not present

## 2024-01-23 DIAGNOSIS — N401 Enlarged prostate with lower urinary tract symptoms: Secondary | ICD-10-CM | POA: Diagnosis not present

## 2024-04-01 ENCOUNTER — Other Ambulatory Visit: Payer: Self-pay | Admitting: Cardiology

## 2024-04-01 NOTE — Telephone Encounter (Signed)
 Prescription refill request for Eliquis  received. Indication:afib Last office visit:9/24 Scr:0.91  9/24 Age: 78 Weight:84.7  kg  Prescription refilled

## 2024-04-17 DIAGNOSIS — H52203 Unspecified astigmatism, bilateral: Secondary | ICD-10-CM | POA: Diagnosis not present

## 2024-04-17 DIAGNOSIS — H2513 Age-related nuclear cataract, bilateral: Secondary | ICD-10-CM | POA: Diagnosis not present

## 2024-06-04 DIAGNOSIS — D2262 Melanocytic nevi of left upper limb, including shoulder: Secondary | ICD-10-CM | POA: Diagnosis not present

## 2024-06-04 DIAGNOSIS — B351 Tinea unguium: Secondary | ICD-10-CM | POA: Diagnosis not present

## 2024-06-04 DIAGNOSIS — D485 Neoplasm of uncertain behavior of skin: Secondary | ICD-10-CM | POA: Diagnosis not present

## 2024-06-04 DIAGNOSIS — D2261 Melanocytic nevi of right upper limb, including shoulder: Secondary | ICD-10-CM | POA: Diagnosis not present

## 2024-06-04 DIAGNOSIS — B353 Tinea pedis: Secondary | ICD-10-CM | POA: Diagnosis not present

## 2024-06-04 DIAGNOSIS — D225 Melanocytic nevi of trunk: Secondary | ICD-10-CM | POA: Diagnosis not present

## 2024-06-04 DIAGNOSIS — L821 Other seborrheic keratosis: Secondary | ICD-10-CM | POA: Diagnosis not present

## 2024-06-04 DIAGNOSIS — L905 Scar conditions and fibrosis of skin: Secondary | ICD-10-CM | POA: Diagnosis not present

## 2024-06-04 DIAGNOSIS — D1801 Hemangioma of skin and subcutaneous tissue: Secondary | ICD-10-CM | POA: Diagnosis not present

## 2024-07-21 DIAGNOSIS — R972 Elevated prostate specific antigen [PSA]: Secondary | ICD-10-CM | POA: Diagnosis not present

## 2024-07-28 DIAGNOSIS — N401 Enlarged prostate with lower urinary tract symptoms: Secondary | ICD-10-CM | POA: Diagnosis not present

## 2024-07-28 DIAGNOSIS — R350 Frequency of micturition: Secondary | ICD-10-CM | POA: Diagnosis not present

## 2024-07-28 DIAGNOSIS — R972 Elevated prostate specific antigen [PSA]: Secondary | ICD-10-CM | POA: Diagnosis not present

## 2024-08-12 NOTE — Progress Notes (Unsigned)
  Cardiology Office Note:   Date:  08/12/2024  ID:  Austin Moore, DOB 1946-01-06, MRN [id number] PCP: Charlott Dorn LABOR, MD  [facility] Providers Cardiologist:  Austin Schilling, MD {  History of Present Illness:   Austin Moore is a 78 y.o. male  who presents for evaluation of palpitations.  He had a history of previous left bundle branch block.  This was first noted in 2010.  He had a negative ischemia workup at that point.  The first time I see a question of atrial fibrillation was mentioned in 2021.  He had chronic left bundle branch block until the last EKG which was interestingly narrow complex.  It was thought that his left bundle branch block was rate related.  He has atrial fib.   ***   ***  He has been monitoring himself routinely with his wearable and has had no further A-fib.  He has avoided caffeine and alcohol which he thinks is his trigger.  He is taking a blood thinner and has had no symptoms related to this. The patient denies any new symptoms such as chest discomfort, neck or arm discomfort. There has been no new shortness of breath, PND or orthopnea. There have been no reported palpitations, presyncope or syncope.   ROS: ***  Studies Reviewed:    EKG:       ***  Risk Assessment/Calculations:   {Does this patient have ATRIAL FIBRILLATION?[phone]} No BP recorded.  {Refresh Note OR Click here to enter BP  :1}***        Physical Exam:   VS:  There were no vitals taken for this visit.   Wt Readings from Last 3 Encounters:  07/18/23 186 lb 12.8 oz (84.7 kg)  01/08/23 179 lb (81.2 kg)  12/20/22 180 lb 12.8 oz (82 kg)     GEN: Well nourished, well developed in no acute distress NECK: No JVD; No carotid bruits CARDIAC: ***RR, *** murmurs, rubs, gallops RESPIRATORY:  Clear to auscultation without rales, wheezing or rhonchi  ABDOMEN: Soft, non-tender, non-distended EXTREMITIES:  No edema; No deformity   ASSESSMENT AND PLAN:   ATRIAL FIB:   ***   We had a long conversation about this.  It is very likely that he has a low burden of A-fib because he is avoiding triggers.  His CHA2DS2-VASc is 2.  I think it is unlikely that he is having any sustained episodes greater than 24 hours that are going unnoticed.  This probably low likelihood that he is having episodes between 6 minutes and 24 hours.  He is probably in the lowest risk group in which case he would be reasonable to discontinue the Eliquis .  He and I had this conversation in detail and he is going to further monitor his fibrillation.  He will let me know via MyChart in a while if he sees no further fibrillation and wants to stop his Eliquis .  Other meds will remain as listed.    LBBB:   ***  This is been rate related.  No change in therapy.       Follow up ***  Signed, Austin Schilling, MD

## 2024-08-14 ENCOUNTER — Ambulatory Visit: Attending: Cardiology | Admitting: Cardiology

## 2024-08-14 ENCOUNTER — Encounter: Payer: Self-pay | Admitting: Cardiology

## 2024-08-14 VITALS — BP 130/80 | HR 80 | Ht 70.0 in | Wt 180.4 lb

## 2024-08-14 DIAGNOSIS — I4891 Unspecified atrial fibrillation: Secondary | ICD-10-CM | POA: Diagnosis not present

## 2024-08-14 DIAGNOSIS — I447 Left bundle-branch block, unspecified: Secondary | ICD-10-CM | POA: Diagnosis not present

## 2024-08-14 NOTE — Patient Instructions (Signed)
 Medication Instructions:  Your physician recommends that you continue on your current medications as directed. Please refer to the Current Medication list given to you today.  *If you need a refill on your cardiac medications before your next appointment, please call your pharmacy*  Lab Work: none If you have labs (blood work) drawn today and your tests are completely normal, you will receive your results only by: MyChart Message (if you have MyChart) OR A paper copy in the mail If you have any lab test that is abnormal or we need to change your treatment, we will call you to review the results.  Testing/Procedures: Your physician has recommended that you wear an event monitor. Event monitors are medical devices that record the heart's electrical activity. Doctors most often us  these monitors to diagnose arrhythmias. Arrhythmias are problems with the speed or rhythm of the heartbeat. The monitor is a small, portable device. You can wear one while you do your normal daily activities. This is usually used to diagnose what is causing palpitations/syncope (passing out).   Follow-Up: At [facility], you and your health needs are our priority.  As part of our continuing mission to provide you with exceptional heart care, our providers are all part of one team.  This team includes your primary Cardiologist (physician) and Advanced Practice Providers or APPs (Physician Assistants and Nurse Practitioners) who all work together to provide you with the care you need, when you need it.  Your next appointment:   1 year(s)  Provider:   Lynwood Schilling, MD    We recommend signing up for the patient portal called MyChart.  Sign up information is provided on this After Visit Summary.  MyChart is used to connect with patients for Virtual Visits (Telemedicine).  Patients are able to view lab/test results, encounter notes, upcoming appointments, etc.  Non-urgent messages can be sent to your provider as  well.   To learn more about what you can do with MyChart, go to [email or web address].   Other Instructions none

## 2024-08-20 DIAGNOSIS — I4891 Unspecified atrial fibrillation: Secondary | ICD-10-CM | POA: Diagnosis not present

## 2024-08-26 DIAGNOSIS — E7800 Pure hypercholesterolemia, unspecified: Secondary | ICD-10-CM | POA: Diagnosis not present

## 2024-08-26 DIAGNOSIS — Z0000 Encounter for general adult medical examination without abnormal findings: Secondary | ICD-10-CM | POA: Diagnosis not present

## 2024-09-09 DIAGNOSIS — Z5181 Encounter for therapeutic drug level monitoring: Secondary | ICD-10-CM | POA: Diagnosis not present

## 2024-09-09 DIAGNOSIS — N401 Enlarged prostate with lower urinary tract symptoms: Secondary | ICD-10-CM | POA: Diagnosis not present

## 2024-09-09 DIAGNOSIS — G729 Myopathy, unspecified: Secondary | ICD-10-CM | POA: Diagnosis not present

## 2024-09-09 DIAGNOSIS — Z0000 Encounter for general adult medical examination without abnormal findings: Secondary | ICD-10-CM | POA: Diagnosis not present

## 2024-09-09 DIAGNOSIS — D6869 Other thrombophilia: Secondary | ICD-10-CM | POA: Diagnosis not present

## 2024-09-09 DIAGNOSIS — Z1331 Encounter for screening for depression: Secondary | ICD-10-CM | POA: Diagnosis not present

## 2024-09-09 DIAGNOSIS — E7800 Pure hypercholesterolemia, unspecified: Secondary | ICD-10-CM | POA: Diagnosis not present

## 2024-09-09 DIAGNOSIS — I4819 Other persistent atrial fibrillation: Secondary | ICD-10-CM | POA: Diagnosis not present

## 2024-09-09 DIAGNOSIS — Z23 Encounter for immunization: Secondary | ICD-10-CM | POA: Diagnosis not present

## 2024-09-09 DIAGNOSIS — G720 Drug-induced myopathy: Secondary | ICD-10-CM | POA: Diagnosis not present

## 2024-09-22 ENCOUNTER — Ambulatory Visit: Attending: Cardiology

## 2024-09-22 DIAGNOSIS — I4891 Unspecified atrial fibrillation: Secondary | ICD-10-CM

## 2024-09-28 ENCOUNTER — Ambulatory Visit: Payer: Self-pay | Admitting: Cardiology

## 2024-09-28 DIAGNOSIS — I4891 Unspecified atrial fibrillation: Secondary | ICD-10-CM | POA: Diagnosis not present

## 2024-10-23 ENCOUNTER — Other Ambulatory Visit: Payer: Self-pay | Admitting: Cardiology
